# Patient Record
Sex: Female | Born: 1940
Health system: Southern US, Community
[De-identification: ages and names within clinical notes are randomized; demographics above are authoritative.]

## PROBLEM LIST (undated history)

## (undated) DIAGNOSIS — I10 Essential (primary) hypertension: Secondary | ICD-10-CM

## (undated) DIAGNOSIS — K589 Irritable bowel syndrome without diarrhea: Secondary | ICD-10-CM

## (undated) HISTORY — PX: ABDOMINAL HYSTERECTOMY: SHX81

---

## 2000-01-19 ENCOUNTER — Ambulatory Visit (HOSPITAL_COMMUNITY): Admission: RE | Admit: 2000-01-19 | Discharge: 2000-01-19 | Payer: Self-pay | Admitting: Emergency Medicine

## 2000-01-19 ENCOUNTER — Encounter: Payer: Self-pay | Admitting: Emergency Medicine

## 2007-09-18 ENCOUNTER — Encounter: Admission: RE | Admit: 2007-09-18 | Discharge: 2007-09-18 | Payer: Self-pay | Admitting: Gastroenterology

## 2008-12-31 ENCOUNTER — Ambulatory Visit: Payer: Self-pay | Admitting: Diagnostic Radiology

## 2008-12-31 ENCOUNTER — Emergency Department (HOSPITAL_BASED_OUTPATIENT_CLINIC_OR_DEPARTMENT_OTHER): Admission: EM | Admit: 2008-12-31 | Discharge: 2008-12-31 | Payer: Self-pay | Admitting: Emergency Medicine

## 2009-01-01 ENCOUNTER — Emergency Department (HOSPITAL_BASED_OUTPATIENT_CLINIC_OR_DEPARTMENT_OTHER): Admission: EM | Admit: 2009-01-01 | Discharge: 2009-01-01 | Payer: Self-pay | Admitting: Emergency Medicine

## 2009-01-01 ENCOUNTER — Ambulatory Visit: Payer: Self-pay | Admitting: Interventional Radiology

## 2010-05-28 IMAGING — CT CT HEAD W/O CM
1 series · 16 of 30 positions shown, 20 images · non-contrast
Comparison: the previous day's study

CLINICAL DATA: Fell

CT HEAD WITHOUT CONTRAST
TECHNIQUE: Contiguous axial images were obtained from the base of
the skull through the vertex without contrast.

[Series 2: head 4.8 h37s · axial · 0.45mm/px · z∈[-117,+16]mm · 16 of 32 slices shown, 20 images]
[im 2/32  brain]
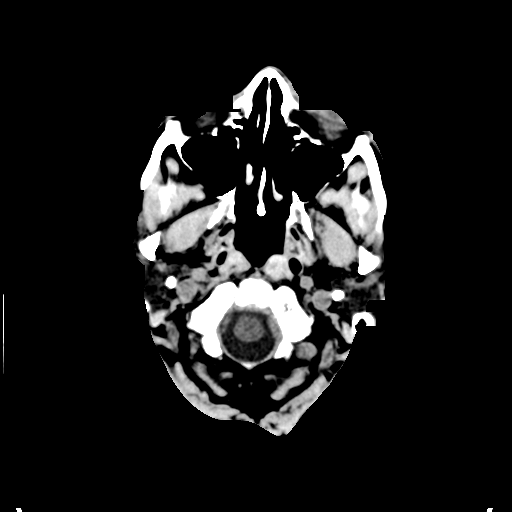
[im 2/32  bone]
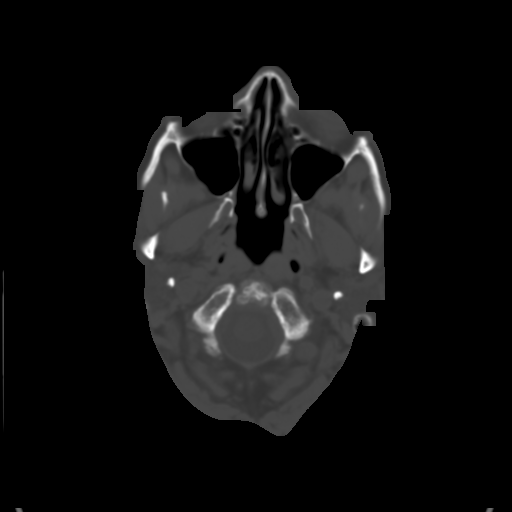
[im 4/32  brain]
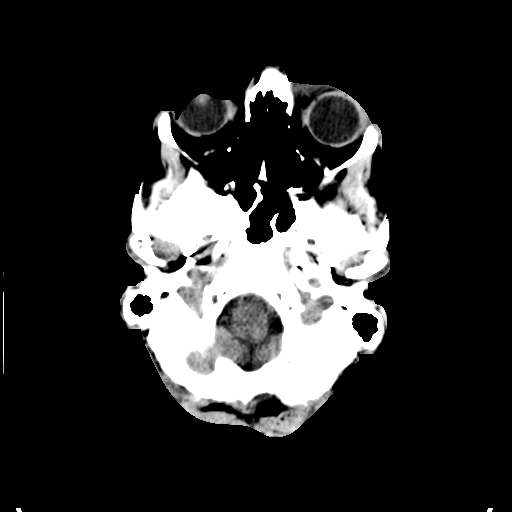
[im 6/32  brain]
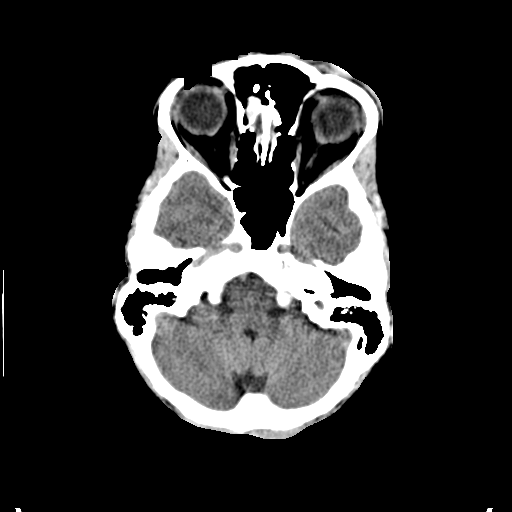
[im 8/32  brain]
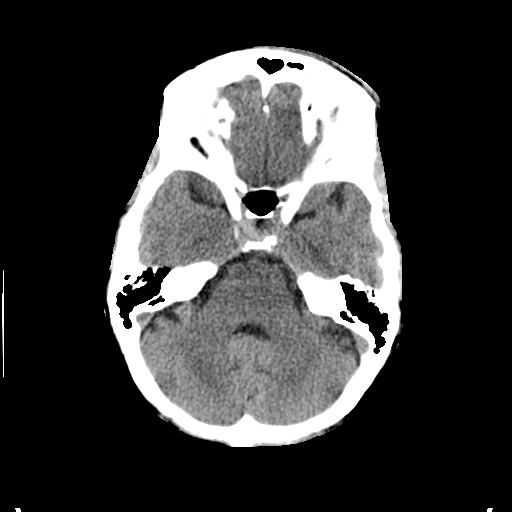
[im 9/32  brain]
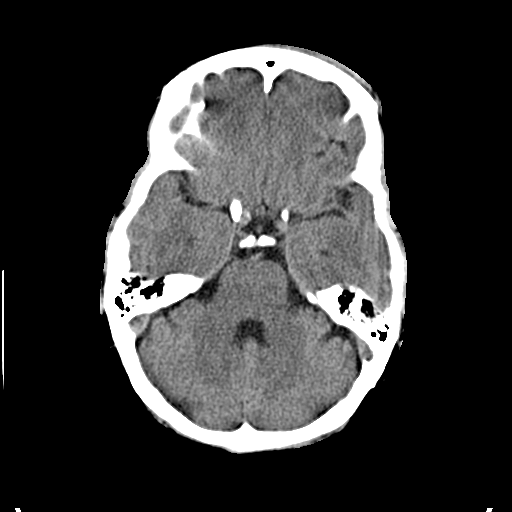
[im 9/32  bone]
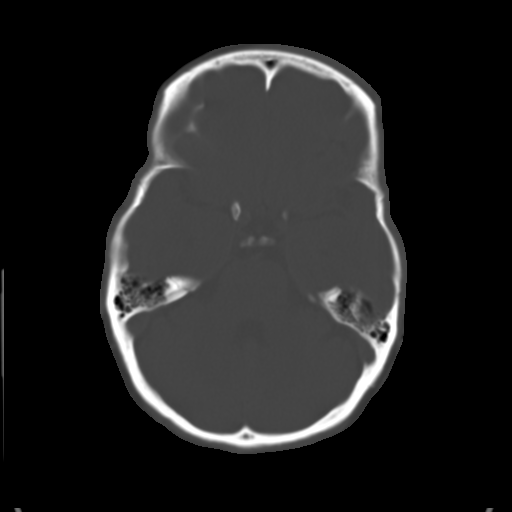
[im 11/32  brain]
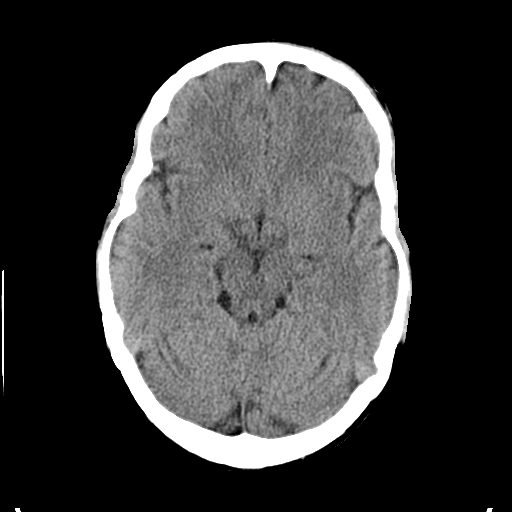
[im 13/32  brain]
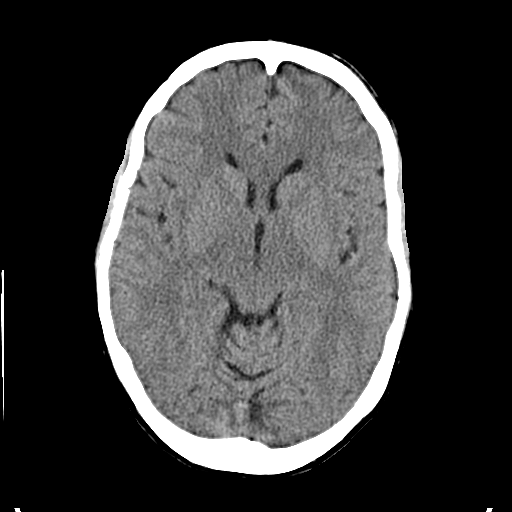
[im 15/32  brain]
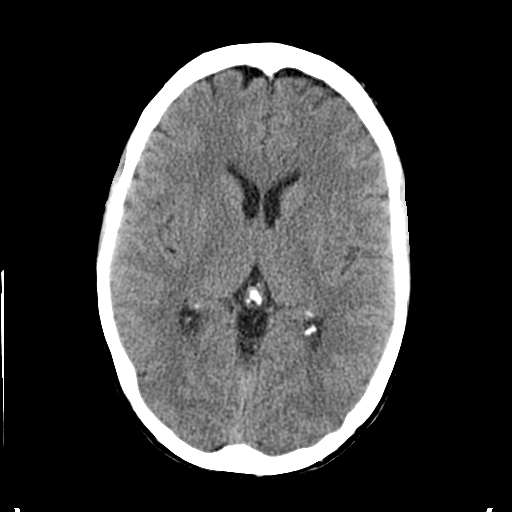
[im 17/32  brain]
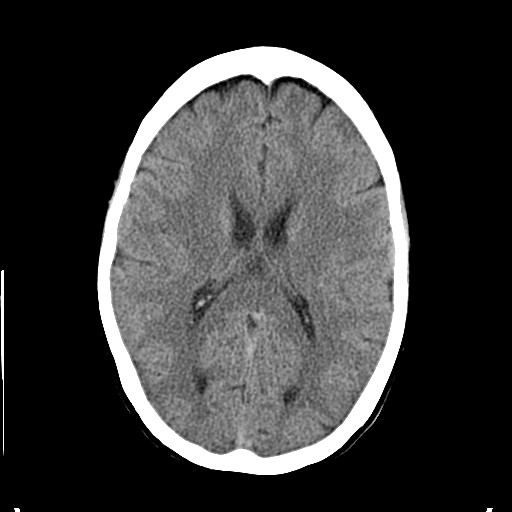
[im 17/32  bone]
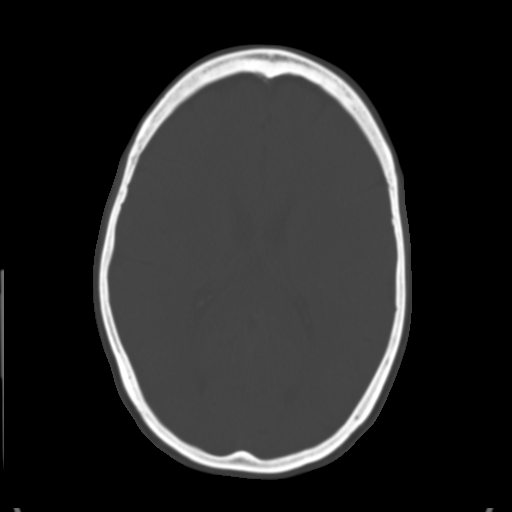
[im 19/32  brain]
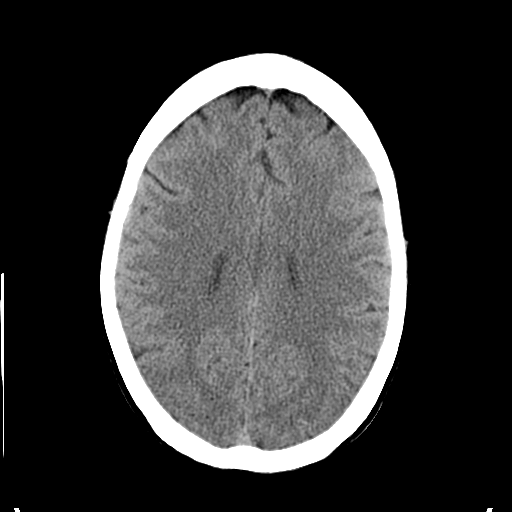
[im 21/32  brain]
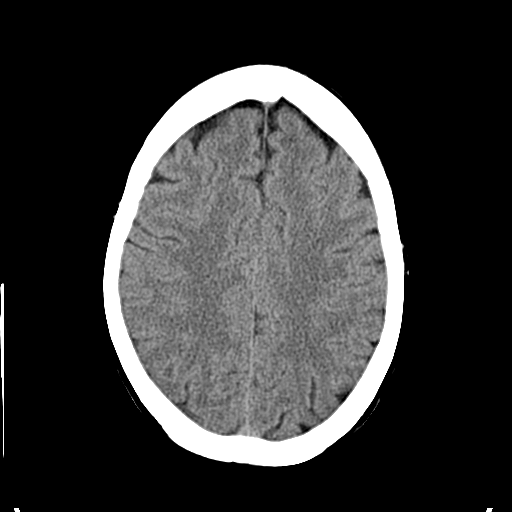
[im 23/32  brain]
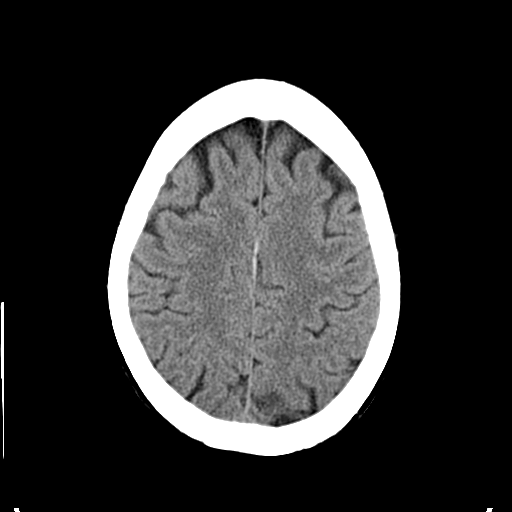
[im 24/32  brain]
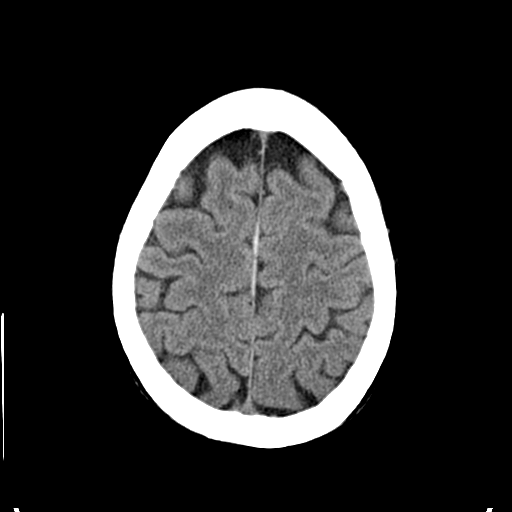
[im 24/32  bone]
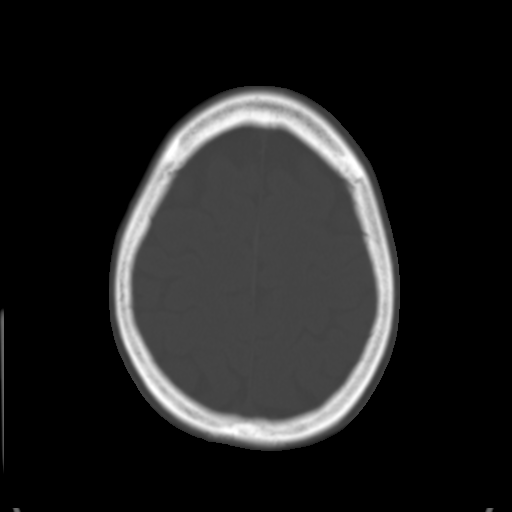
[im 26/32  brain]
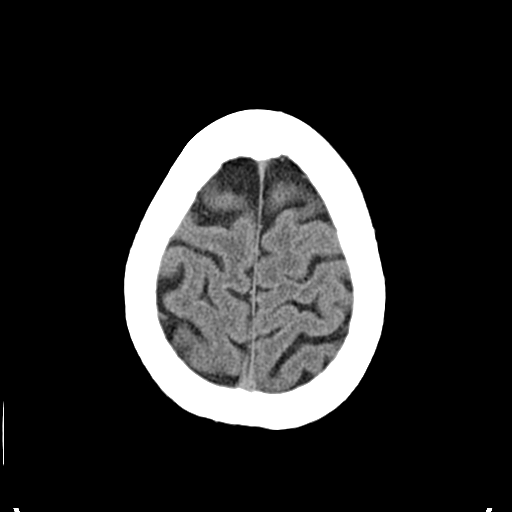
[im 28/32  brain]
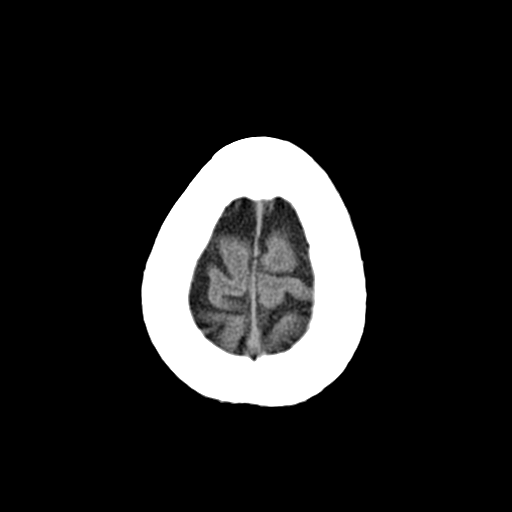
[im 30/32  brain]
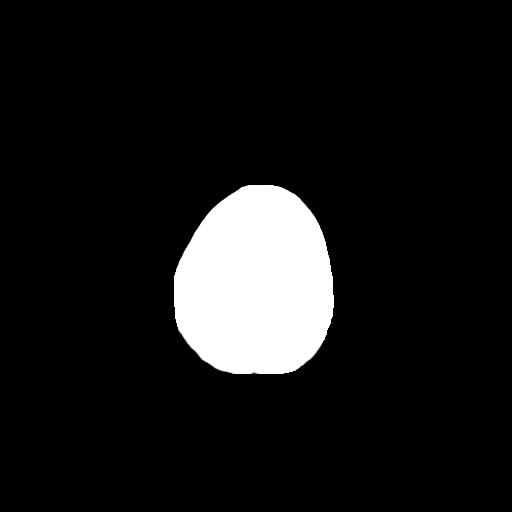

[16 of 30 positions shown; findings below may reference images not displayed]

FINDINGS: Left frontal scalp hematoma.  The right parafalcine
subdural hematoma near the vertex is stable in size, measuring
approximate 2 mm maximal thickness.  No new intracranial
hemorrhage.  No focal parenchymal edema, midline shift, mass, or
mass effect.  Acute infarct may be inapparent on noncontrast CT.
The ventricles and sulci are normal in size and symmetry.  Bone
windows show no calvarial lesions.
IMPRESSION: 1.  Stable small right falcine subdural hematoma near the vertex.
2.  Negative for acute or superimposed abnormality.

## 2011-03-26 LAB — CBC
HCT: 37.6 % (ref 36.0–46.0)
Hemoglobin: 12.9 g/dL (ref 12.0–15.0)
Platelets: 206 10*3/uL (ref 150–400)
RBC: 4.29 MIL/uL (ref 3.87–5.11)
WBC: 5.4 10*3/uL (ref 4.0–10.5)

## 2011-03-26 LAB — BASIC METABOLIC PANEL
GFR calc non Af Amer: >60 mL/min (ref >60)
Potassium: 3.8 mEq/L (ref 3.5–5.1)
Sodium: 137 mEq/L (ref 135–145)

## 2011-03-26 LAB — DIFFERENTIAL
Eosinophils Relative: 1 % (ref 0–5)
Lymphocytes Relative: 19 % (ref 12–46)
Lymphs Abs: 1 10*3/uL (ref 0.7–4.0)
Monocytes Absolute: 0.3 10*3/uL (ref 0.1–1.0)
Monocytes Relative: 5 % (ref 3–12)

## 2011-03-26 LAB — ETHANOL: Alcohol, Ethyl (B): 72 mg/dL — ABNORMAL HIGH (ref 0–10)

## 2012-07-25 DIAGNOSIS — K649 Unspecified hemorrhoids: Secondary | ICD-10-CM | POA: Insufficient documentation

## 2012-08-05 ENCOUNTER — Ambulatory Visit (INDEPENDENT_AMBULATORY_CARE_PROVIDER_SITE_OTHER): Payer: TRICARE For Life (TFL) | Admitting: General Surgery

## 2012-08-21 DIAGNOSIS — I10 Essential (primary) hypertension: Secondary | ICD-10-CM | POA: Diagnosis present

## 2012-10-06 DIAGNOSIS — R140 Abdominal distension (gaseous): Secondary | ICD-10-CM | POA: Insufficient documentation

## 2012-10-06 DIAGNOSIS — K5904 Chronic idiopathic constipation: Secondary | ICD-10-CM | POA: Insufficient documentation

## 2013-01-08 DIAGNOSIS — K6389 Other specified diseases of intestine: Secondary | ICD-10-CM | POA: Insufficient documentation

## 2013-01-08 DIAGNOSIS — K589 Irritable bowel syndrome without diarrhea: Secondary | ICD-10-CM | POA: Insufficient documentation

## 2013-12-21 DIAGNOSIS — K635 Polyp of colon: Secondary | ICD-10-CM | POA: Insufficient documentation

## 2015-01-05 ENCOUNTER — Ambulatory Visit (INDEPENDENT_AMBULATORY_CARE_PROVIDER_SITE_OTHER): Payer: Medicare Other

## 2015-01-05 ENCOUNTER — Ambulatory Visit (INDEPENDENT_AMBULATORY_CARE_PROVIDER_SITE_OTHER): Payer: Medicare Other | Admitting: Podiatry

## 2015-01-05 VITALS — BP 110/62 | HR 63 | Resp 17

## 2015-01-05 DIAGNOSIS — M2040 Other hammer toe(s) (acquired), unspecified foot: Secondary | ICD-10-CM

## 2015-01-05 DIAGNOSIS — M779 Enthesopathy, unspecified: Secondary | ICD-10-CM

## 2015-01-05 NOTE — Progress Notes (Signed)
   Subjective:    Patient ID: Katherine Garrison, female    DOB: [birth year], 74 y.o.   MRN: [id number]  HPI  Pt presents with painful hammertoe deformity on right foot 2nd and 3rd met  Review of Systems  Gastrointestinal: Positive for constipation and abdominal distention.  Allergic/Immunologic: Positive for environmental allergies.       Objective:   Physical Exam        Assessment & Plan:

## 2015-01-06 NOTE — Progress Notes (Signed)
Subjective:     Patient ID: Katherine RunningSusan B Garrison, female   DOB: Oct 13, 1941, [age] y.o.   MRN: [id number]  HPI patient presents with pain in the lesser MPJs left and has old orthotics which are no longer adequate   Review of Systems  All other systems reviewed and are negative.      Objective:   Physical Exam  Constitutional: She is oriented to person, place, and time.  Cardiovascular: Intact distal pulses.   Musculoskeletal: Normal range of motion.  Neurological: She is oriented to person, place, and time.  Skin: Skin is warm.  Nursing note and vitals reviewed.  neurovascular status intact with muscle strength adequate range of motion within normal limits and elevation of the lesser digits with mild redness on the dorsal surfaces. 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 Patient wants to have them made and is scanned today for custom type devices

## 2015-01-26 ENCOUNTER — Ambulatory Visit: Payer: Medicare Other | Admitting: *Deleted

## 2015-01-26 DIAGNOSIS — M779 Enthesopathy, unspecified: Secondary | ICD-10-CM

## 2015-01-26 NOTE — Patient Instructions (Signed)

## 2015-01-26 NOTE — Progress Notes (Signed)
PICKING UP INSERTS  

## 2015-11-21 DIAGNOSIS — N952 Postmenopausal atrophic vaginitis: Secondary | ICD-10-CM

## 2015-11-21 DIAGNOSIS — F419 Anxiety disorder, unspecified: Secondary | ICD-10-CM | POA: Diagnosis present

## 2015-11-21 DIAGNOSIS — J309 Allergic rhinitis, unspecified: Secondary | ICD-10-CM | POA: Insufficient documentation

## 2015-11-21 HISTORY — DX: Postmenopausal atrophic vaginitis: N95.2

## 2016-06-22 DIAGNOSIS — M79609 Pain in unspecified limb: Secondary | ICD-10-CM

## 2016-09-24 DIAGNOSIS — M8580 Other specified disorders of bone density and structure, unspecified site: Secondary | ICD-10-CM | POA: Insufficient documentation

## 2017-06-24 DIAGNOSIS — H40053 Ocular hypertension, bilateral: Secondary | ICD-10-CM | POA: Insufficient documentation

## 2017-08-02 ENCOUNTER — Emergency Department (HOSPITAL_BASED_OUTPATIENT_CLINIC_OR_DEPARTMENT_OTHER): Payer: Medicare Other

## 2017-08-02 ENCOUNTER — Encounter (HOSPITAL_BASED_OUTPATIENT_CLINIC_OR_DEPARTMENT_OTHER): Payer: Self-pay

## 2017-08-02 ENCOUNTER — Emergency Department (HOSPITAL_BASED_OUTPATIENT_CLINIC_OR_DEPARTMENT_OTHER)
Admission: EM | Admit: 2017-08-02 | Discharge: 2017-08-02 | Disposition: A | Discharge status: Home / Self Care | Payer: Medicare Other | Attending: Emergency Medicine | Admitting: Emergency Medicine

## 2017-08-02 DIAGNOSIS — S9031XA Contusion of right foot, initial encounter: Secondary | ICD-10-CM | POA: Insufficient documentation

## 2017-08-02 DIAGNOSIS — E860 Dehydration: Secondary | ICD-10-CM | POA: Insufficient documentation

## 2017-08-02 DIAGNOSIS — Z91040 Latex allergy status: Secondary | ICD-10-CM | POA: Diagnosis not present

## 2017-08-02 DIAGNOSIS — I10 Essential (primary) hypertension: Secondary | ICD-10-CM | POA: Diagnosis not present

## 2017-08-02 DIAGNOSIS — R197 Diarrhea, unspecified: Secondary | ICD-10-CM | POA: Diagnosis not present

## 2017-08-02 DIAGNOSIS — Y92012 Bathroom of single-family (private) house as the place of occurrence of the external cause: Secondary | ICD-10-CM | POA: Diagnosis not present

## 2017-08-02 DIAGNOSIS — Z79899 Other long term (current) drug therapy: Secondary | ICD-10-CM | POA: Diagnosis not present

## 2017-08-02 DIAGNOSIS — Y9389 Activity, other specified: Secondary | ICD-10-CM | POA: Diagnosis not present

## 2017-08-02 DIAGNOSIS — R112 Nausea with vomiting, unspecified: Secondary | ICD-10-CM | POA: Diagnosis not present

## 2017-08-02 DIAGNOSIS — W19XXXA Unspecified fall, initial encounter: Secondary | ICD-10-CM | POA: Diagnosis not present

## 2017-08-02 DIAGNOSIS — Z87891 Personal history of nicotine dependence: Secondary | ICD-10-CM | POA: Insufficient documentation

## 2017-08-02 DIAGNOSIS — R110 Nausea: Secondary | ICD-10-CM | POA: Diagnosis present

## 2017-08-02 DIAGNOSIS — Y998 Other external cause status: Secondary | ICD-10-CM | POA: Diagnosis not present

## 2017-08-02 HISTORY — DX: Essential (primary) hypertension: I10

## 2017-08-02 HISTORY — DX: Irritable bowel syndrome without diarrhea: K58.9

## 2017-08-02 HISTORY — DX: Irritable bowel syndrome, unspecified: K58.9

## 2017-08-02 LAB — COMPREHENSIVE METABOLIC PANEL
ALK PHOS: 54 U/L (ref 38–126)
ALT: 16 U/L (ref 14–54)
AST: 29 U/L (ref 15–41)
Albumin: 4.5 g/dL (ref 3.5–5.0)
Anion gap: 9 (ref 5–15)
BUN: 14 mg/dL (ref 6–20)
CHLORIDE: 101 mmol/L (ref 101–111)
CO2: 28 mmol/L (ref 22–32)
CREATININE: 0.72 mg/dL (ref 0.44–1.00)
Calcium: 9.2 mg/dL (ref 8.9–10.3)
Glucose, Bld: 126 mg/dL — ABNORMAL HIGH (ref 65–99)
Potassium: 3.3 mmol/L — ABNORMAL LOW (ref 3.5–5.1)
Sodium: 138 mmol/L (ref 135–145)
TOTAL PROTEIN: 7.7 g/dL (ref 6.5–8.1)
Total Bilirubin: 0.9 mg/dL (ref 0.3–1.2)

## 2017-08-02 LAB — URINALYSIS, ROUTINE W REFLEX MICROSCOPIC
Bilirubin Urine: NEGATIVE
GLUCOSE, UA: NEGATIVE mg/dL
Hgb urine dipstick: NEGATIVE
KETONES UR: NEGATIVE mg/dL
NITRITE: NEGATIVE
PH: 7.5 (ref 5.0–8.0)
Protein, ur: 30 mg/dL — AB
SPECIFIC GRAVITY, URINE: 1.017 (ref 1.005–1.030)

## 2017-08-02 LAB — CBC
HEMATOCRIT: 42.7 % (ref 36.0–46.0)
HEMOGLOBIN: 14.6 g/dL (ref 12.0–15.0)
MCH: 30.2 pg (ref 26.0–34.0)
MCHC: 34.2 g/dL (ref 30.0–36.0)
MCV: 88.4 fL (ref 78.0–100.0)
Platelets: 180 10*3/uL (ref 150–400)
RBC: 4.83 MIL/uL (ref 3.87–5.11)
RDW: 12.6 % (ref 11.5–15.5)
WBC: 10.5 10*3/uL (ref 4.0–10.5)

## 2017-08-02 LAB — LIPASE, BLOOD: Lipase: 34 U/L (ref 11–51)

## 2017-08-02 LAB — URINALYSIS, MICROSCOPIC (REFLEX): RBC / HPF: NONE SEEN RBC/hpf (ref 0–5)

## 2017-08-02 LAB — I-STAT CG4 LACTIC ACID, ED: Lactic Acid, Venous: 2.05 mmol/L (ref 0.5–1.9)

## 2017-08-02 MED ORDER — ONDANSETRON HCL 4 MG PO TABS: 4.0000 mg | ORAL_TABLET | Freq: Four times a day (QID) | ORAL | 0 refills | Status: DC | PRN

## 2017-08-02 MED ORDER — ONDANSETRON HCL 4 MG/2ML IJ SOLN
4.0000 mg | Freq: Once | INTRAMUSCULAR | Status: AC | PRN
  Administered 2017-08-02: 4 mg via INTRAVENOUS
  Filled 2017-08-02: qty 2

## 2017-08-02 MED ORDER — SODIUM CHLORIDE 0.9 % IV BOLUS (SEPSIS)
1000.0000 mL | Freq: Once | INTRAVENOUS | Status: AC
  Administered 2017-08-02: 1000 mL via INTRAVENOUS

## 2017-08-02 MED ORDER — CEPHALEXIN 500 MG PO CAPS: 500.0000 mg | ORAL_CAPSULE | Freq: Two times a day (BID) | ORAL | 0 refills | Status: DC

## 2017-08-02 MED ORDER — CEPHALEXIN 250 MG PO CAPS
500.0000 mg | ORAL_CAPSULE | Freq: Once | ORAL | Status: AC
  Administered 2017-08-02: 500 mg via ORAL
  Filled 2017-08-02: qty 2

## 2017-08-02 MED FILL — CEPHALEXIN 500 MG CAPSULE: 500 | 7 days supply | Qty: 14 | Fill #0

## 2017-08-02 MED FILL — ONDANSETRON HCL 4 MG TABLET: 4 | 3 days supply | Qty: 12 | Fill #0

## 2017-08-02 NOTE — ED Notes (Signed)
Pt shivering nonstop during EKG. MD aware.

## 2017-08-02 NOTE — ED Triage Notes (Signed)
Pt arrived via [facility] via [facility]. Pt c/o abd pain with N/V that started this AM. Pt reports. Pt had a near syncopal episodes. Pt has family members that are ill.

## 2017-08-02 NOTE — ED Notes (Signed)
Pt c/o R foot pain which she states she injured during her near syncopal episode.

## 2017-08-02 NOTE — ED Notes (Signed)
ED Provider at bedside. 

## 2017-08-02 NOTE — ED Notes (Signed)
Lactic Acid 2.05 reported to Denice Bors RN

## 2017-08-02 NOTE — ED Notes (Signed)
Pt drinking water 

## 2017-08-02 NOTE — ED Notes (Signed)
B[facility] to room.

## 2017-08-02 NOTE — ED Provider Notes (Signed)
[facility]   CSN: [id number] Arrival date & time: 08/02/17  4098     History   Chief Complaint Chief Complaint  Patient presents with  . Nausea    HPI Katherine Garrison is a 76 y.o. female.  The history is provided by the patient. No language interpreter was used.   Katherine Garrison is a 76 y.o. female who presents to the Emergency Department complaining of N/V/D.  Early this morning around 3 AM she developed severe diarrhea with numerous watery stools associated vomiting, 5-6 episodes. She denies any hematochezia or melena. No fevers but she does have chills and abdominal cramping. Her husband has similar symptoms and her son recently visited and had similar symptoms. No known bad food exposures. Symptoms are severe and constant in nature. She was experiencing an episode of diarrhea and emesis at the same time and then attempted to get up and felt the floor, hurting her foot. No known head injury. She may have transiently lost consciousness. No chest pain or shortness of breath. Past Medical History:  Diagnosis Date  . Hypertension   . IBS (irritable bowel syndrome)     There are no active problems to display for this patient.   Past Surgical History:  Procedure Laterality Date  . ABDOMINAL HYSTERECTOMY      OB History    No data available       Home Medications    Prior to Admission medications   Medication Sig Start Date End Date Taking? Authorizing Provider  lubiprostone (AMITIZA) 8 MCG capsule Take 8 mcg by mouth 2 (two) times daily with a meal.   Yes [provider]  amLODipine (NORVASC) 2.5 MG tablet Take 2.5 mg by mouth daily.    [provider]  busPIRone (BUSPAR) 15 MG tablet Take 15 mg by mouth 3 (three) times daily.    [provider]  Calcium-Vitamin D-Vitamin K (VIACTIV PO) Take by mouth.    [provider]  cephALEXin (KEFLEX) 500 MG capsule Take 1 capsule (500 mg total) by mouth 2 (two) times daily.  08/02/17   Tilden Fossa, MD  dicyclomine (BENTYL) 10 MG capsule Take 10 mg by mouth 4 (four) times daily -  before meals and at bedtime.    [provider]  estrogens, conjugated, (PREMARIN) 0.625 MG tablet Take 0.625 mg by mouth daily. Take daily for 21 days then do not take for 7 days.    [provider]  fluticasone (VERAMYST) 27.5 MCG/SPRAY nasal spray Place 2 sprays into the nose daily.    [provider]  Multiple Vitamins-Minerals (WOMENS ONE DAILY PO) Take by mouth.    [provider]  ondansetron (ZOFRAN) 4 MG tablet Take 1 tablet (4 mg total) by mouth every 6 (six) hours as needed for nausea or vomiting. 08/02/17   Tilden Fossa, MD  Polyethylene Glycol 3350 (MIRALAX PO) Take by mouth.    [provider]  Probiotic Product (ALIGN PO) Take by mouth.    [provider]  sertraline (ZOLOFT) 50 MG tablet Take 50 mg by mouth daily.    [provider]  valsartan-hydrochlorothiazide (DIOVAN-HCT) 320-25 MG per tablet Take 1 tablet by mouth daily.    [provider]    Family History No family history on file.  Social History Social History  Substance Use Topics  . Smoking status: Former Games developer  . Smokeless tobacco: Never Used  . Alcohol use Yes     Comment: occasional  Allergies   Latex; Neosporin [neomycin-bacitracin zn-polymyx]; and Sulfa antibiotics   Review of Systems Review of Systems  All other systems reviewed and are negative.    Physical Exam Updated Vital Signs BP 135/83 (BP Location: Right Arm)   Pulse 86   Temp 97.9 F (36.6 C) (Rectal)   Resp 18   Ht 5\' 6"  (1.676 m)   Wt 55.3 kg (122 lb)   SpO2 100%   BMI 19.69 kg/m   Physical Exam  Constitutional: She is oriented to person, place, and time. She appears well-developed and well-nourished.  HENT:  Head: Normocephalic and atraumatic.  Cardiovascular: Normal rate and regular rhythm.   No murmur heard. Pulmonary/Chest: Effort  normal and breath sounds normal. No respiratory distress.  Abdominal: Soft. There is no rebound and no guarding.  Moderate lower abdominal tenderness  Musculoskeletal:  2+ DP pulses bilaterally. There is ecchymosis and mild tenderness to the lateral aspect of the right midfoot. No ankle tenderness to palpation.  Neurological: She is alert and oriented to person, place, and time.  Skin: Skin is warm and dry.  Psychiatric: She has a normal mood and affect. Her behavior is normal.  Nursing note and vitals reviewed.    ED Treatments / Results  Labs (all labs ordered are listed, but only abnormal results are displayed) Labs Reviewed  COMPREHENSIVE METABOLIC PANEL - Abnormal; Notable for the following:       Result Value   Potassium 3.3 (*)    Glucose, Bld 126 (*)    All other components within normal limits  URINALYSIS, ROUTINE W REFLEX MICROSCOPIC - Abnormal; Notable for the following:    APPearance TURBID (*)    Protein, ur 30 (*)    Leukocytes, UA SMALL (*)    All other components within normal limits  URINALYSIS, MICROSCOPIC (REFLEX) - Abnormal; Notable for the following:    Bacteria, UA MANY (*)    Squamous Epithelial / LPF 6-30 (*)    All other components within normal limits  I-STAT CG4 LACTIC ACID, ED - Abnormal; Notable for the following:    Lactic Acid, Venous 2.05 (*)    All other components within normal limits  URINE CULTURE  LIPASE, BLOOD  CBC    EKG  EKG Interpretation  Date/Time:  Friday August 02 2017 10:13:27 EDT Ventricular Rate:  77 PR Interval:    QRS Duration: 137 QT Interval:  557 QTC Calculation: 631 R Axis:   67 Text Interpretation:  Sinus rhythm IVCD, consider atypical RBBB Baseline wander in lead(s) V1 Interpretation limited secondary to artifact Confirmed by Tilden Fossa [phone]) on 08/02/2017 10:23:08 AM       Radiology Dg Foot Complete Right  Result Date: 08/02/2017 CLINICAL DATA:  76 year old female with foot pain. EXAM: RIGHT FOOT  COMPLETE - 3+ VIEW COMPARISON:  None. FINDINGS: There is no evidence of fracture or dislocation. There is diffuse osteopenia, otherwise no focal bone abnormality. Soft tissues are unremarkable. IMPRESSION: Negative. Electronically Signed   By: Sande Brothers M.D.   On: 08/02/2017 10:40    Procedures Procedures (including critical care time)  Medications Ordered in ED Medications  ondansetron (ZOFRAN) injection 4 mg (4 mg Intravenous Given 08/02/17 0956)  sodium chloride 0.9 % bolus 1,000 mL (0 mLs Intravenous Stopped 08/02/17 1113)  cephALEXin (KEFLEX) capsule 500 mg (500 mg Oral Given 08/02/17 1109)     Initial Impression / Assessment and Plan / ED Course  I have reviewed the triage vital signs and the nursing notes.  Pertinent labs & imaging results that were available during my care of the patient were reviewed by me and considered in my medical decision making (see chart for details).     Patient here for evaluation of nausea, vomiting, diarrhea, near syncopal episode. UA with many bacteria, few leukocytes, few squamous. She does not have any urinary symptoms but given many bacteria will send culture initiated for possible cystitis. In terms of vomiting and diarrhea this is likely related to gastroenteritis with multiple contacts with similar symptoms. She is feeling improved on repeat assessment. Plan to DC home with outpatient follow-up and return precautions.  Current clinical picture is not consistent with serious bacterial infection, perforated viscus, diverticulitis.  Final Clinical Impressions(s) / ED Diagnoses   Final diagnoses:  Nausea vomiting and diarrhea  Contusion of right foot, initial encounter  Dehydration    New Prescriptions Discharge Medication List as of 08/02/2017 11:16 AM    START taking these medications   Details  cephALEXin (KEFLEX) 500 MG capsule Take 1 capsule (500 mg total) by mouth 2 (two) times daily., Starting Fri 08/02/2017, Print    ondansetron  (ZOFRAN) 4 MG tablet Take 1 tablet (4 mg total) by mouth every 6 (six) hours as needed for nausea or vomiting., Starting Fri 08/02/2017, Print         Tilden Fossa, MD 08/02/17 1539

## 2017-08-03 LAB — URINE CULTURE: Culture: NO GROWTH

## 2018-05-24 DIAGNOSIS — Z87898 Personal history of other specified conditions: Secondary | ICD-10-CM | POA: Insufficient documentation

## 2018-05-24 HISTORY — DX: Personal history of other specified conditions: Z87.898

## 2018-11-18 DIAGNOSIS — I700 Atherosclerosis of aorta: Secondary | ICD-10-CM | POA: Insufficient documentation

## 2018-12-24 DIAGNOSIS — K5732 Diverticulitis of large intestine without perforation or abscess without bleeding: Secondary | ICD-10-CM | POA: Insufficient documentation

## 2018-12-26 DIAGNOSIS — K5730 Diverticulosis of large intestine without perforation or abscess without bleeding: Secondary | ICD-10-CM

## 2018-12-26 HISTORY — DX: Diverticulosis of large intestine without perforation or abscess without bleeding: K57.30

## 2018-12-27 IMAGING — DX DG FOOT COMPLETE 3+V*R*
3 series · 3 of 3 positions shown · non-contrast
Comparison: None.

CLINICAL DATA: 75-year-old female with foot pain.

EXAM:
RIGHT FOOT COMPLETE - 3+ VIEW

[foot ap]
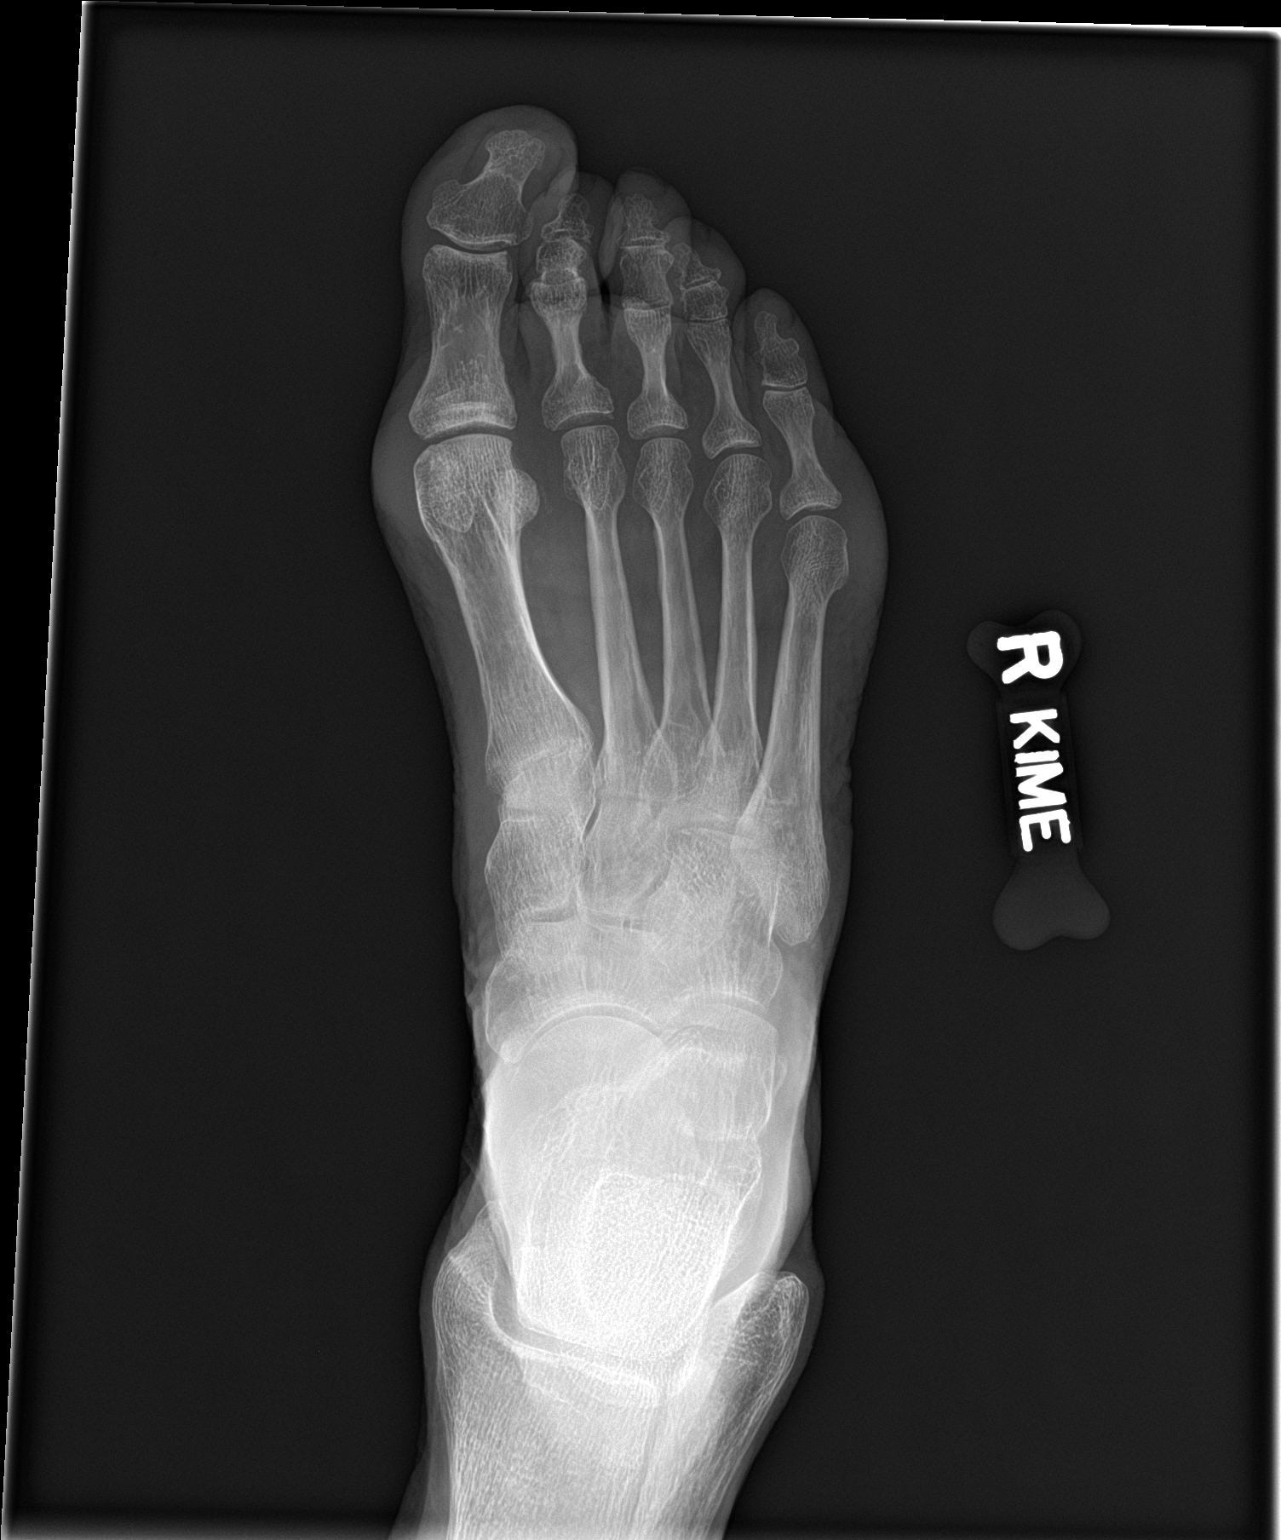

[foot obl]
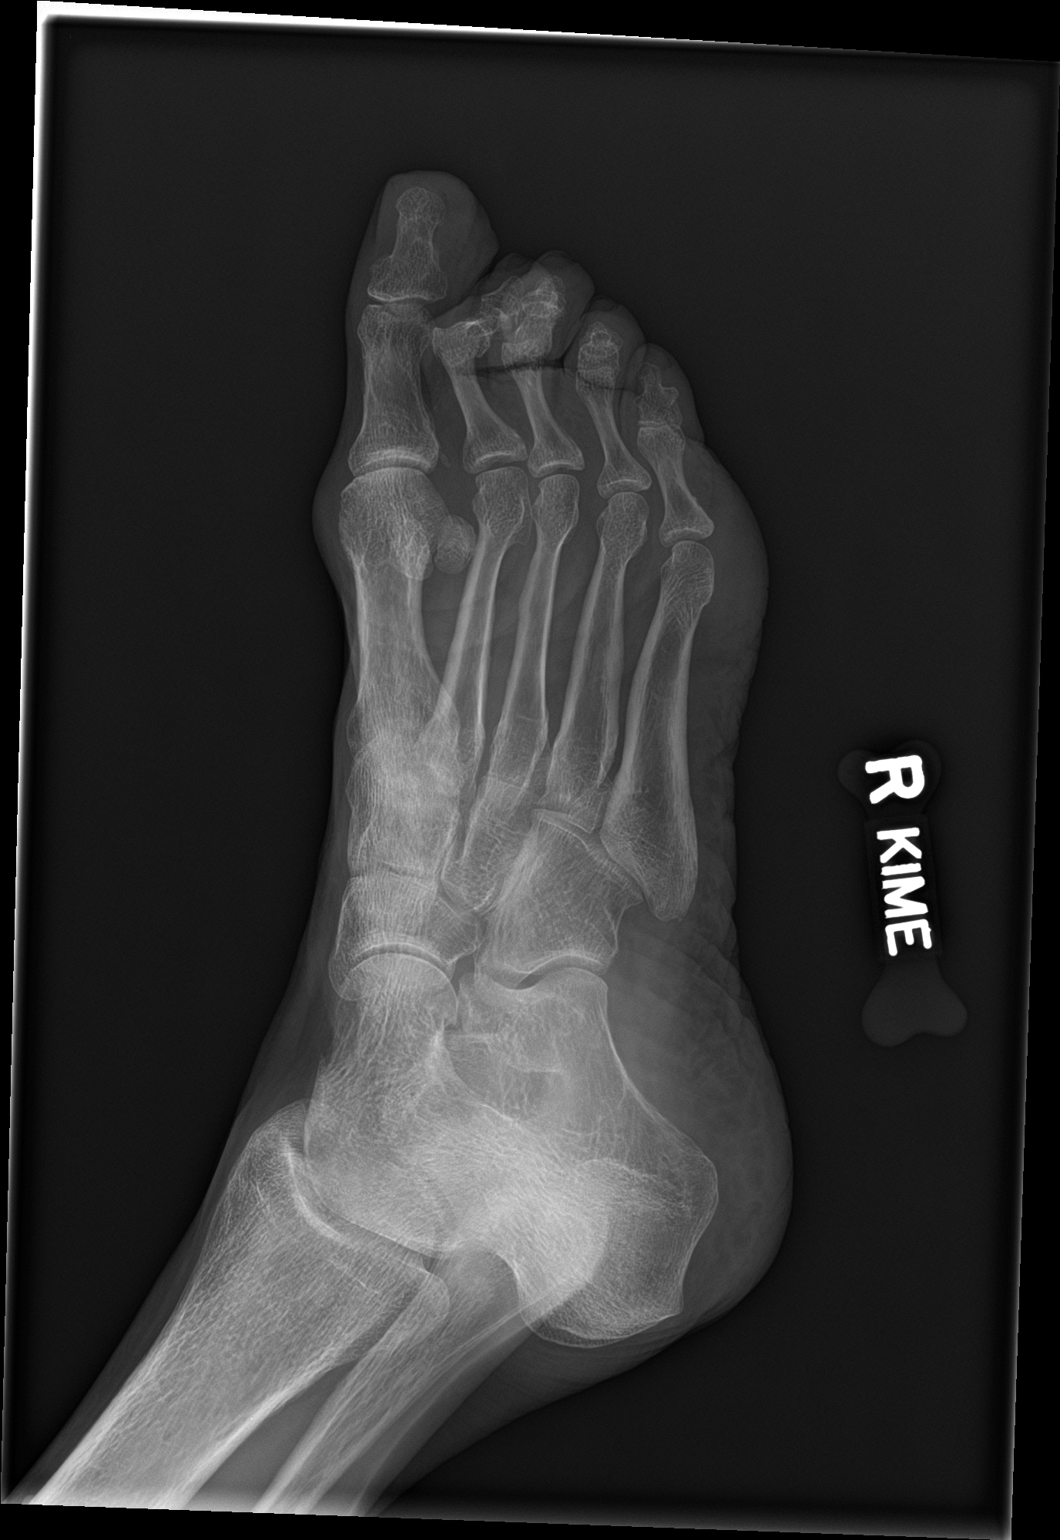

[foot lat]
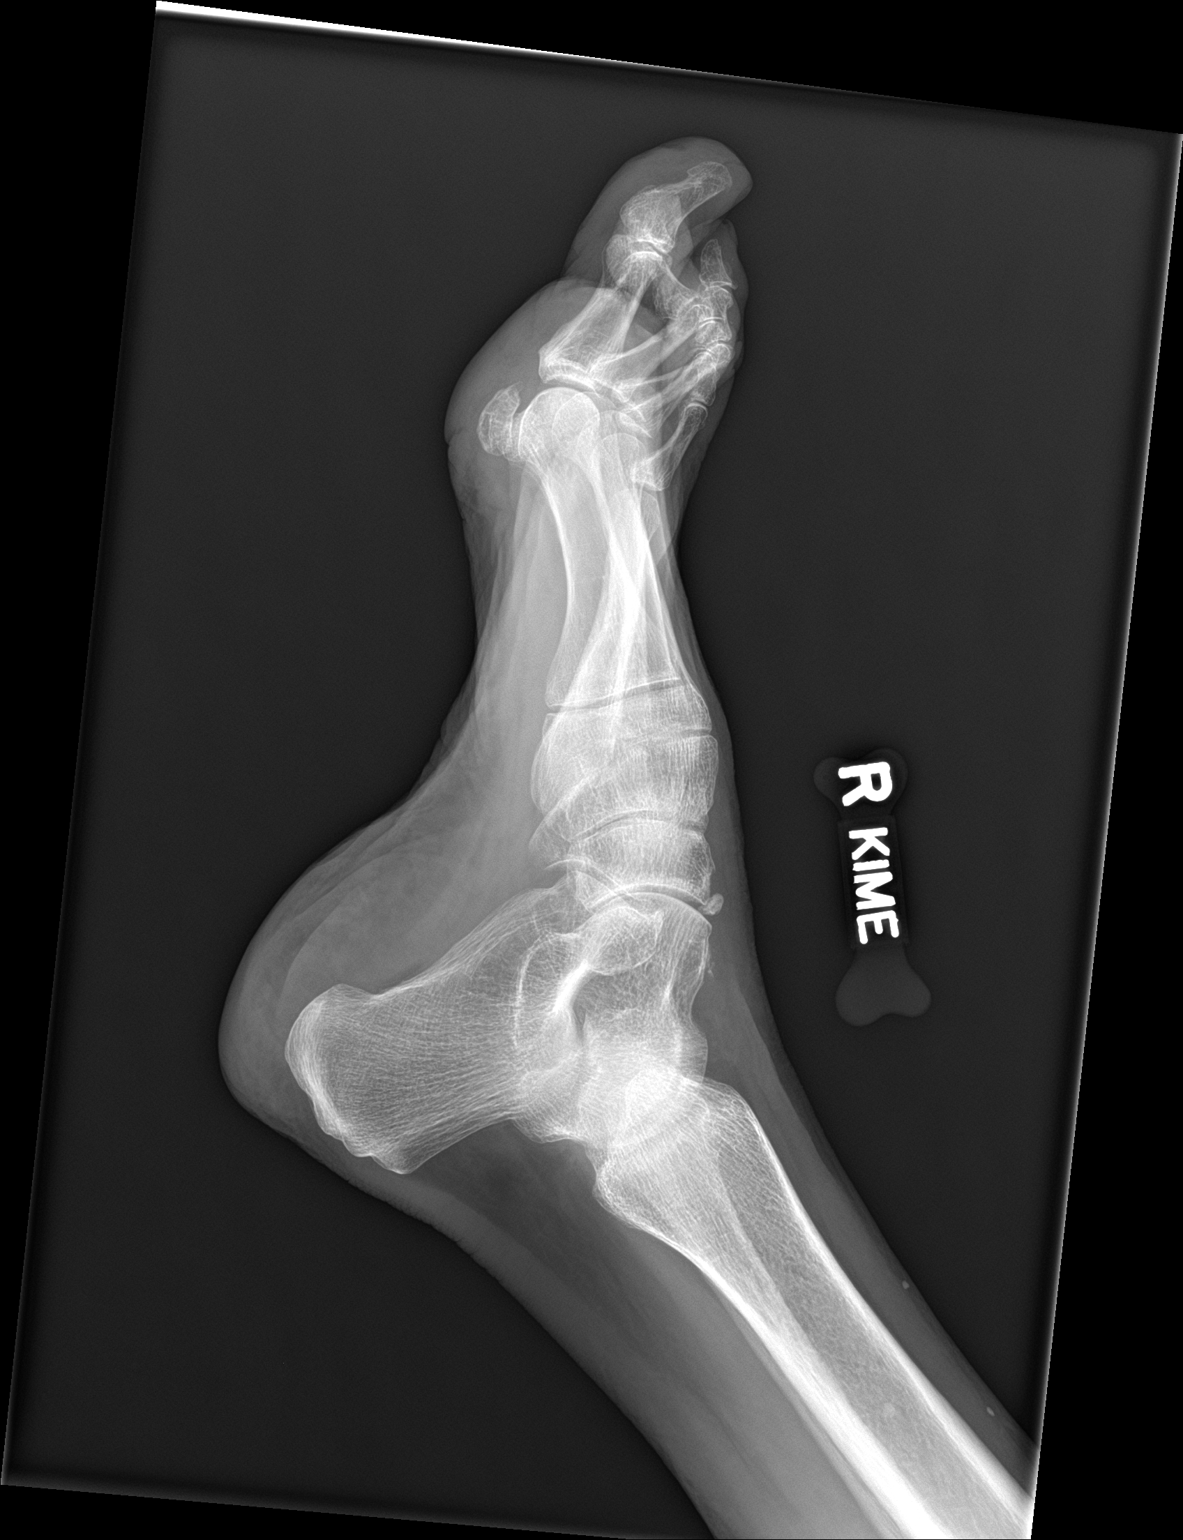

[3 of 3 positions shown; findings below may reference images not displayed]

FINDINGS: There is no evidence of fracture or dislocation. There is diffuse
osteopenia, otherwise no focal bone abnormality. Soft tissues are
unremarkable.
IMPRESSION: Negative.

## 2019-01-07 DIAGNOSIS — Z8719 Personal history of other diseases of the digestive system: Secondary | ICD-10-CM

## 2019-01-07 HISTORY — DX: Personal history of other diseases of the digestive system: Z87.19

## 2020-08-23 DIAGNOSIS — H02831 Dermatochalasis of right upper eyelid: Secondary | ICD-10-CM | POA: Insufficient documentation

## 2020-09-29 DIAGNOSIS — K623 Rectal prolapse: Secondary | ICD-10-CM | POA: Insufficient documentation

## 2021-02-22 DIAGNOSIS — B3731 Acute candidiasis of vulva and vagina: Secondary | ICD-10-CM | POA: Insufficient documentation

## 2023-03-27 DIAGNOSIS — M1612 Unilateral primary osteoarthritis, left hip: Secondary | ICD-10-CM | POA: Insufficient documentation

## 2023-04-20 DIAGNOSIS — M533 Sacrococcygeal disorders, not elsewhere classified: Secondary | ICD-10-CM | POA: Insufficient documentation

## 2023-05-25 DIAGNOSIS — M7070 Other bursitis of hip, unspecified hip: Secondary | ICD-10-CM | POA: Insufficient documentation

## 2023-06-20 DIAGNOSIS — M7918 Myalgia, other site: Secondary | ICD-10-CM | POA: Insufficient documentation

## 2023-08-30 ENCOUNTER — Emergency Department (HOSPITAL_COMMUNITY): Payer: Medicare Other

## 2023-08-30 ENCOUNTER — Other Ambulatory Visit: Payer: Self-pay

## 2023-08-30 ENCOUNTER — Ambulatory Visit: Payer: Self-pay | Admitting: Student

## 2023-08-30 ENCOUNTER — Encounter (HOSPITAL_COMMUNITY): Payer: Self-pay

## 2023-08-30 ENCOUNTER — Inpatient Hospital Stay (HOSPITAL_COMMUNITY)
Admission: EM | Admit: 2023-08-30 | Discharge: 2023-09-04 | DRG: 481 | Disposition: A | Discharge status: Skilled Nursing Facility | Payer: Medicare Other | Attending: Internal Medicine | Admitting: Internal Medicine

## 2023-08-30 DIAGNOSIS — R55 Syncope and collapse: Secondary | ICD-10-CM | POA: Diagnosis present

## 2023-08-30 DIAGNOSIS — S0512XA Contusion of eyeball and orbital tissues, left eye, initial encounter: Secondary | ICD-10-CM | POA: Diagnosis present

## 2023-08-30 DIAGNOSIS — Z881 Allergy status to other antibiotic agents status: Secondary | ICD-10-CM

## 2023-08-30 DIAGNOSIS — S72002A Fracture of unspecified part of neck of left femur, initial encounter for closed fracture: Principal | ICD-10-CM | POA: Diagnosis present

## 2023-08-30 DIAGNOSIS — Y92000 Kitchen of unspecified non-institutional (private) residence as  the place of occurrence of the external cause: Secondary | ICD-10-CM

## 2023-08-30 DIAGNOSIS — Z90710 Acquired absence of both cervix and uterus: Secondary | ICD-10-CM

## 2023-08-30 DIAGNOSIS — I959 Hypotension, unspecified: Secondary | ICD-10-CM | POA: Diagnosis not present

## 2023-08-30 DIAGNOSIS — S72092A Other fracture of head and neck of left femur, initial encounter for closed fracture: Secondary | ICD-10-CM

## 2023-08-30 DIAGNOSIS — M25552 Pain in left hip: Secondary | ICD-10-CM | POA: Diagnosis not present

## 2023-08-30 DIAGNOSIS — J9811 Atelectasis: Secondary | ICD-10-CM | POA: Diagnosis not present

## 2023-08-30 DIAGNOSIS — Z882 Allergy status to sulfonamides status: Secondary | ICD-10-CM

## 2023-08-30 DIAGNOSIS — I10 Essential (primary) hypertension: Secondary | ICD-10-CM | POA: Diagnosis present

## 2023-08-30 DIAGNOSIS — E871 Hypo-osmolality and hyponatremia: Secondary | ICD-10-CM | POA: Diagnosis present

## 2023-08-30 DIAGNOSIS — Z79890 Hormone replacement therapy (postmenopausal): Secondary | ICD-10-CM

## 2023-08-30 DIAGNOSIS — Z79899 Other long term (current) drug therapy: Secondary | ICD-10-CM

## 2023-08-30 DIAGNOSIS — K589 Irritable bowel syndrome without diarrhea: Secondary | ICD-10-CM | POA: Diagnosis present

## 2023-08-30 DIAGNOSIS — D62 Acute posthemorrhagic anemia: Secondary | ICD-10-CM | POA: Diagnosis not present

## 2023-08-30 DIAGNOSIS — E876 Hypokalemia: Secondary | ICD-10-CM | POA: Diagnosis present

## 2023-08-30 DIAGNOSIS — S72142A Displaced intertrochanteric fracture of left femur, initial encounter for closed fracture: Principal | ICD-10-CM | POA: Diagnosis present

## 2023-08-30 DIAGNOSIS — W19XXXA Unspecified fall, initial encounter: Secondary | ICD-10-CM | POA: Diagnosis present

## 2023-08-30 DIAGNOSIS — Z91040 Latex allergy status: Secondary | ICD-10-CM

## 2023-08-30 DIAGNOSIS — Z87891 Personal history of nicotine dependence: Secondary | ICD-10-CM

## 2023-08-30 DIAGNOSIS — R0902 Hypoxemia: Secondary | ICD-10-CM | POA: Diagnosis not present

## 2023-08-30 DIAGNOSIS — F419 Anxiety disorder, unspecified: Secondary | ICD-10-CM | POA: Diagnosis present

## 2023-08-30 DIAGNOSIS — M1990 Unspecified osteoarthritis, unspecified site: Secondary | ICD-10-CM | POA: Diagnosis present

## 2023-08-30 LAB — CBC WITH DIFFERENTIAL/PLATELET
Abs Immature Granulocytes: 0.06 10*3/uL (ref 0.00–0.07)
Basophils Absolute: 0 10*3/uL (ref 0.0–0.1)
Basophils Relative: 0 %
Eosinophils Absolute: 0 10*3/uL (ref 0.0–0.5)
Eosinophils Relative: 1 %
HCT: 34.8 % — ABNORMAL LOW (ref 36.0–46.0)
Hemoglobin: 11.6 g/dL — ABNORMAL LOW (ref 12.0–15.0)
Immature Granulocytes: 1 %
Lymphocytes Relative: 25 %
Lymphs Abs: 1.4 10*3/uL (ref 0.7–4.0)
MCH: 30.6 pg (ref 26.0–34.0)
MCHC: 33.3 g/dL (ref 30.0–36.0)
MCV: 91.8 fL (ref 80.0–100.0)
Monocytes Absolute: 0.4 10*3/uL (ref 0.1–1.0)
Monocytes Relative: 7 %
Neutro Abs: 3.8 10*3/uL (ref 1.7–7.7)
Neutrophils Relative %: 66 %
Platelets: 197 10*3/uL (ref 150–400)
RBC: 3.79 MIL/uL — ABNORMAL LOW (ref 3.87–5.11)
RDW: 12.4 % (ref 11.5–15.5)
WBC: 5.8 10*3/uL (ref 4.0–10.5)
nRBC: 0 % (ref 0.0–0.2)

## 2023-08-30 MED ORDER — MORPHINE SULFATE (PF) 4 MG/ML IV SOLN
4.0000 mg | Freq: Once | INTRAVENOUS | Status: AC
  Administered 2023-08-31: 2 mg via INTRAVENOUS
  Filled 2023-08-30 (×2): qty 1

## 2023-08-30 MED ORDER — ONDANSETRON HCL 4 MG/2ML IJ SOLN
INTRAMUSCULAR | Status: AC
  Filled 2023-08-30: qty 2

## 2023-08-30 MED ORDER — SODIUM CHLORIDE 0.9 % IV BOLUS
1000.0000 mL | Freq: Once | INTRAVENOUS | Status: AC
  Administered 2023-08-30: 1000 mL via INTRAVENOUS

## 2023-08-30 MED ORDER — ONDANSETRON HCL 4 MG/2ML IJ SOLN
4.0000 mg | Freq: Once | INTRAMUSCULAR | Status: AC
  Administered 2023-08-30: 4 mg via INTRAVENOUS

## 2023-08-30 NOTE — ED Provider Notes (Signed)
[place] EMERGENCY DEPARTMENT AT [facility] Provider Note   CSN: [id number] Arrival date & time: 08/30/23  2134     History  Chief Complaint  Patient presents with   Fall   Hip Pain    Katherine Garrison is a 82 y.o. female history of arthritis, hypertension here presenting with fall.  Patient apparently had a syncopal event and fell.  Family was in the other room and heard a thump and noticed that Katherine Garrison was on the floor.  Katherine Garrison was noted to have obvious deformity of the left hip.  Patient has been having problems with the left hip for several years now.  Patient had seen Dr. Lequita Halt from Ortho for the hip.  The history is provided by the patient.       Home Medications Prior to Admission medications   Medication Sig Start Date End Date Taking? Authorizing Provider  amLODipine (NORVASC) 2.5 MG tablet Take 2.5 mg by mouth daily.    [provider]  busPIRone (BUSPAR) 15 MG tablet Take 15 mg by mouth 3 (three) times daily.    [provider]  Calcium-Vitamin D-Vitamin K (VIACTIV PO) Take by mouth.    [provider]  cephALEXin (KEFLEX) 500 MG capsule Take 1 capsule (500 mg total) by mouth 2 (two) times daily. 08/02/17   Tilden Fossa, MD  dicyclomine (BENTYL) 10 MG capsule Take 10 mg by mouth 4 (four) times daily -  before meals and at bedtime.    [provider]  estrogens, conjugated, (PREMARIN) 0.625 MG tablet Take 0.625 mg by mouth daily. Take daily for 21 days then do not take for 7 days.    [provider]  fluticasone (VERAMYST) 27.5 MCG/SPRAY nasal spray Place 2 sprays into the nose daily.    [provider]  lubiprostone (AMITIZA) 8 MCG capsule Take 8 mcg by mouth 2 (two) times daily with a meal.    [provider]  Multiple Vitamins-Minerals (WOMENS ONE DAILY PO) Take by mouth.    [provider]  ondansetron (ZOFRAN) 4 MG tablet Take 1 tablet (4 mg total) by mouth every 6 (six) hours as needed for  nausea or vomiting. 08/02/17   Tilden Fossa, MD  Polyethylene Glycol 3350 (MIRALAX PO) Take by mouth.    [provider]  Probiotic Product (ALIGN PO) Take by mouth.    [provider]  sertraline (ZOLOFT) 50 MG tablet Take 50 mg by mouth daily.    [provider]  valsartan-hydrochlorothiazide (DIOVAN-HCT) 320-25 MG per tablet Take 1 tablet by mouth daily.    [provider]      Allergies    Latex, Neosporin [neomycin-bacitracin zn-polymyx], and Sulfa antibiotics    Review of Systems   Review of Systems  Musculoskeletal:        Left hip pain  All other systems reviewed and are negative.   Physical Exam Updated Vital Signs BP 110/66 (BP Location: Left Arm)   Pulse 70   Temp 97.8 F (36.6 C) (Oral)   Resp 14   Ht 5\' 6"  (1.676 m)   Wt 58.2 kg   SpO2 98%   BMI 20.72 kg/m  Physical Exam Vitals and nursing note reviewed.  Constitutional:      Comments: Uncomfortable  HENT:     Head:     Comments: Patient has frontal scalp hematoma    Nose: Nose normal.     Mouth/Throat:     Mouth: Mucous membranes are moist.  Eyes:     Extraocular Movements: Extraocular movements intact.     Pupils: Pupils are equal, round, and reactive to light.  Cardiovascular:     Rate and Rhythm: Normal rate and regular rhythm.     Pulses: Normal pulses.     Heart sounds: Normal heart sounds.  Pulmonary:     Effort: Pulmonary effort is normal.     Breath sounds: Normal breath sounds.  Abdominal:     General: Abdomen is flat.     Palpations: Abdomen is soft.  Musculoskeletal:     Cervical back: Normal range of motion and neck supple.     Comments: Obvious deformity of the left hip.  Patient has no obvious left knee tenderness or deformity.  No other extremity trauma  Skin:    General: Skin is warm.     Capillary Refill: Capillary refill takes less than 2 seconds.  Neurological:     General: No focal deficit present.     Mental Status: Katherine Garrison is alert and  oriented to person, place, and time.  Psychiatric:        Mood and Affect: Mood normal.        Behavior: Behavior normal.     ED Results / Procedures / Treatments   Labs (all labs ordered are listed, but only abnormal results are displayed) Labs Reviewed  CBC WITH DIFFERENTIAL/PLATELET - Abnormal; Notable for the following components:      Result Value   RBC 3.79 (*)    Hemoglobin 11.6 (*)    HCT 34.8 (*)    All other components within normal limits  CBC WITH DIFFERENTIAL/PLATELET  COMPREHENSIVE METABOLIC PANEL  ETHANOL  TROPONIN I (HIGH SENSITIVITY)    EKG None  Radiology No results found.  Procedures Procedures    Medications Ordered in ED Medications  morphine (PF) 4 MG/ML injection 4 mg (has no administration in time range)  ondansetron (ZOFRAN) injection 4 mg (4 mg Intravenous Given 08/30/23 2201)    ED Course/ Medical Decision Making/ A&P                                 Medical Decision Making Katherine Garrison is a 82 y.o. female here presenting with left hip pain.  Patient fell and hit the left hip.  Patient apparently had a syncopal event prior to the fall.   11:09 PM Discussed with Dr. Linna Caprice from ortho.  He recommend medicine admission and n.p.o. after midnight.  Labs are pending and CT head and cervical spine are pending.  Signed out to Dr. Clayborne Dana.  Patient will need admission after labs and CT results   Amount and/or Complexity of Data Reviewed Labs: ordered. Radiology: ordered. ECG/medicine tests: ordered.  Risk Prescription drug management.    Final Clinical Impression(s) / ED Diagnoses Final diagnoses:  None    Rx / DC Orders ED Discharge Orders     None         Charlynne Pander, MD 08/30/23 2311

## 2023-08-30 NOTE — ED Notes (Signed)
Dr. Clayborne Dana is at bedside

## 2023-08-30 NOTE — ED Notes (Signed)
Patient transported to CT 

## 2023-08-30 NOTE — Consult Note (Signed)
ORTHOPAEDIC CONSULTATION  REQUESTING PHYSICIAN: Mesner, Barbara Cower, MD  PCP:  Loyal Jacobson, MD  Chief Complaint: Left hip pain, fall.   HPI: Katherine Garrison is a 82 y.o. female with arthritis, hypertension here presenting with fall.  Patient apparently had a syncopal event and fell.  Family was in the other room and heard a thump and noticed that she was on the floor. She does not remember exactly what happened. She has a left eye contusion and noted to have obvious deformity of the left hip. She reports pain in her left hip with movement. She denies any other pain/injury. She denies any tingling or numbness in LE bilaterally. X-rays showed a displaced left IT fracture. Orthopedics was consulted. Patient has been having problems with the left hip for several years now.  Patient has seen Dr. Lequita Halt for left hip.   She denies any use of blood thinners. She lives at home with her husband. She does not normally use any assistive devices. No significant history of DVT/PE.   Past Medical History:  Diagnosis Date   Hypertension    IBS (irritable bowel syndrome)    Past Surgical History:  Procedure Laterality Date   ABDOMINAL HYSTERECTOMY     Social History   Socioeconomic History   Marital status: Married    Spouse name: Not on file   Number of children: Not on file   Years of education: Not on file   Highest education level: Not on file  Occupational History   Not on file  Tobacco Use   Smoking status: Former   Smokeless tobacco: Never  Vaping Use   Vaping status: Never Used  Substance and Sexual Activity   Alcohol use: Yes    Comment: occasional   Drug use: No   Sexual activity: Not on file  Other Topics Concern   Not on file  Social History Narrative   Not on file   Social Determinants of Health   Financial Resource Strain: Not on file  Food Insecurity: Not on file  Transportation Needs: Not on file  Physical Activity: Not on file  Stress: Not on file  Social  Connections: Not on file   History reviewed. No pertinent family history. Allergies  Allergen Reactions   Latex    Neosporin [Neomycin-Bacitracin Zn-Polymyx] Rash   Sulfa Antibiotics Rash   Prior to Admission medications   Medication Sig Start Date End Date Taking? Authorizing Provider  amLODipine (NORVASC) 2.5 MG tablet Take 2.5 mg by mouth daily.    [provider]  busPIRone (BUSPAR) 15 MG tablet Take 15 mg by mouth 3 (three) times daily.    [provider]  Calcium-Vitamin D-Vitamin K (VIACTIV PO) Take by mouth.    [provider]  cephALEXin (KEFLEX) 500 MG capsule Take 1 capsule (500 mg total) by mouth 2 (two) times daily. 08/02/17   Tilden Fossa, MD  dicyclomine (BENTYL) 10 MG capsule Take 10 mg by mouth 4 (four) times daily -  before meals and at bedtime.    [provider]  estrogens, conjugated, (PREMARIN) 0.625 MG tablet Take 0.625 mg by mouth daily. Take daily for 21 days then do not take for 7 days.    [provider]  fluticasone (VERAMYST) 27.5 MCG/SPRAY nasal spray Place 2 sprays into the nose daily.    [provider]  lubiprostone (AMITIZA) 8 MCG capsule Take 8 mcg by mouth 2 (two) times daily with a meal.    [provider]  Multiple Vitamins-Minerals (WOMENS ONE DAILY PO) Take by mouth.    [provider]  ondansetron (ZOFRAN) 4 MG tablet Take 1 tablet (4 mg total) by mouth every 6 (six) hours as needed for nausea or vomiting. 08/02/17   Tilden Fossa, MD  Polyethylene Glycol 3350 (MIRALAX PO) Take by mouth.    [provider]  Probiotic Product (ALIGN PO) Take by mouth.    [provider]  sertraline (ZOLOFT) 50 MG tablet Take 50 mg by mouth daily.    [provider]  valsartan-hydrochlorothiazide (DIOVAN-HCT) 320-25 MG per tablet Take 1 tablet by mouth daily.    [provider]   DG Femur Portable Min 2 Views Left  Result Date: 08/30/2023 CLINICAL DATA:   Status post fall. EXAM: LEFT FEMUR PORTABLE 2 VIEWS COMPARISON:  None Available. FINDINGS: There is an acute, comminuted fracture deformity involving the inter trochanteric region of the proximal left femur. There is no evidence of dislocation. Soft tissue swelling is seen at the previously noted fracture site. IMPRESSION: Acute, comminuted intertrochanteric fracture of the proximal left femur. Electronically Signed   By: Aram Candela M.D.   On: 08/30/2023 23:40   DG Knee Left Port  Result Date: 08/30/2023 CLINICAL DATA:  Status post fall. EXAM: PORTABLE LEFT KNEE - 1-2 VIEW COMPARISON:  None Available. FINDINGS: No evidence of fracture, dislocation, or joint effusion. Moderate severity tricompartmental joint space narrowing is seen. Mild medial and lateral chondrocalcinosis is also noted. There is mild medial soft tissue swelling. IMPRESSION: Moderate severity tricompartmental degenerative changes. Electronically Signed   By: Aram Candela M.D.   On: 08/30/2023 23:39   CT Cervical Spine Wo Contrast  Result Date: 08/30/2023 CLINICAL DATA:  Status post fall. EXAM: CT CERVICAL SPINE WITHOUT CONTRAST TECHNIQUE: Multidetector CT imaging of the cervical spine was performed without intravenous contrast. Multiplanar CT image reconstructions were also generated. RADIATION DOSE REDUCTION: This exam was performed according to the departmental dose-optimization program which includes automated exposure control, adjustment of the mA and/or kV according to patient size and/or use of iterative reconstruction technique. COMPARISON:  None Available. FINDINGS: Alignment: Normal. Skull base and vertebrae: No acute fracture. Chronic and degenerative changes seen involving the body and tip of the dens, as well as the adjacent portion of the anterior arch of C1. Soft tissues and spinal canal: No prevertebral fluid or swelling. No visible canal hematoma. Disc levels: Marked severity endplate sclerosis, anterior  osteophyte formation and posterior bony spurring are seen at the levels of C4-C5, C5-C6, C6-C7 and C7-T1. There is marked severity narrowing of the anterior atlantoaxial articulation, with marked severity intervertebral disc space narrowing at the levels of C4-C5, C5-C6, C6-C7 and C7-T1. Bilateral marked severity multilevel facet joint hypertrophy is noted. Upper chest: There is mild to moderate severity biapical scarring and/or atelectasis. Other: None. IMPRESSION: Marked severity multilevel degenerative changes, as described above, without evidence of an acute fracture or subluxation. Electronically Signed   By: Aram Candela M.D.   On: 08/30/2023 23:37   CT HEAD WO CONTRAST ( )  Result Date: 08/30/2023 CLINICAL DATA:  Status post fall. EXAM: CT HEAD WITHOUT CONTRAST TECHNIQUE: Contiguous axial images were obtained from the base of the skull through the vertex without intravenous contrast. RADIATION DOSE REDUCTION: This exam was performed according to the departmental dose-optimization program which includes automated exposure control, adjustment of the mA and/or kV according to patient size and/or use of iterative reconstruction technique. COMPARISON:  May 13, 2020 FINDINGS: Brain: There is mild cerebral atrophy  with widening of the extra-axial spaces and ventricular dilatation. There are areas of decreased attenuation within the white matter tracts of the supratentorial brain, consistent with microvascular disease changes. 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 Vascular: No hyperdense vessel or unexpected calcification. Skull: Normal. Negative for fracture or focal lesion. Sinuses/Orbits: No acute finding. Other: None. IMPRESSION: 1. Generalized cerebral atrophy with mild, chronic white matter small vessel ischemic changes. 2. No acute intracranial abnormality. Electronically Signed   By: Aram Candela M.D.   On: 08/30/2023 23:33   DG Hip Port [place] or [place] Pelvis 1 View  Left  Result Date: 08/30/2023 CLINICAL DATA:  Status post fall. EXAM: DG HIP (WITH OR WITHOUT PELVIS) 1V PORT LEFT COMPARISON:  None Available. FINDINGS: There is an acute, comminuted fracture deformity extending through the inter trochanteric region of the proximal left femur. There is no evidence of dislocation. Mild to moderate severity degenerative changes seen involving both hips, in the form of joint space narrowing and acetabular sclerosis. IMPRESSION: Acute, comminuted intertrochanteric fracture of the proximal left femur. Electronically Signed   By: Aram Candela M.D.   On: 08/30/2023 23:31   DG Chest Port 1 View  Result Date: 08/30/2023 CLINICAL DATA:  Status post fall. EXAM: PORTABLE CHEST 1 VIEW COMPARISON:  October 31, 2019 FINDINGS: The heart size and mediastinal contours are within normal limits. Both lungs are clear. Partially calcified bilateral breast implants are noted. A chronic fracture deformity is seen involving the distal right clavicle, with a radiopaque intramedullary rod and fixation screws seen within the proximal to mid left humerus. There is mild S-shaped scoliosis of the thoracolumbar spine. IMPRESSION: Stable chronic and postoperative changes without evidence of acute cardiopulmonary disease. Electronically Signed   By: Aram Candela M.D.   On: 08/30/2023 23:30    Positive ROS: All other systems have been reviewed and were otherwise negative with the exception of those mentioned in the HPI and as above.  Physical Exam: General: Alert, no acute distress. C-collar intact. Contusion above left eye.  Cardiovascular: No pedal edema Respiratory: No cyanosis, no use of accessory musculature GI: No organomegaly, abdomen is soft and non-tender Skin: No lesions in the area of chief complaint Neurologic: Sensation intact distally Psychiatric: Patient is competent for consent with normal mood and affect Lymphatic: No axillary or cervical  lymphadenopathy  MUSCULOSKELETAL:   Examination of the left hip reveals no skin wounds or lesions. LLE shortened and externally rotated. Tenderness to palpation over the trochanteric region.   Sensory and motor function intact in LE bilaterally including plantar flexion, dorsiflexion, and EHL. Distal pedal pulses 2+ bilaterally. Capillary refill <2 seconds.   Examination of UE bilaterally showed no skin wounds or lesions except slight bruise on left shoulder. No TTP in UE bilaterally. Sensory and motor function intact in UE bilaterally. Radial and ulnar pulses 2+ bilaterally. Capillary refill <2 seconds.   Assessment: Comminuted left intertrochanteric femur fracture.   Plan: Patient has a displaced left hip fracture that will require surgical treatment for pain control and allow immediate mobilization out of bed. TRH to be consulted for admission and perioperative medical optimization, labs pending. Plan for left hip IM fixation with Dr. Aundria Rud tomorrow. Hold chemical DVT ppx. NPO after midnight tonight. All question solicited and answered.     Katherine Dupes, PA-C    08/30/2023 11:54 PM

## 2023-08-30 NOTE — ED Triage Notes (Addendum)
Patient BIB [facility] from home with complaint of syncope & fall & left hip pain.  Per EMS family witnessed fall. Family reported that patient LOC for 10-20 seconds. Did hit head on the left side. Patient had 1 glass wine tonight.   Left leg shortened and rotated out.  20g LFA  EMS administered: 150 mcg Fent  Patient presents to ER with bruising noted to the left eye, left shoulder, left leg shortened and rotated out. Patient placed on 2 L [place] due to O2 sat decreased to 88% on room air.

## 2023-08-30 NOTE — ED Provider Notes (Signed)
11:25 PM Assumed care from Dr. Silverio Lay, please see their note for full history, physical and decision making until this point. In brief this is a 82 y.o. year old female who presented to the ED tonight with Fall and Hip Pain     Syncopal episode with L hip pain.  Alucio pt, broke hip, spoke with swinteck, pending workup for syncope, NPO for surgery tomorrow.  Patient with an episode of hypotension but resolved pretty quickly still getting fluids.  On further evaluation soundly her syncope was probably orthostatic.  She did have some wine.  There was no prodrome though which does make a little bit more concerning.  Troponins negative, CBC is reassuring, EKG is reassuring, slightly low potassium and a slightly elevated anion gap but no evidence of DKA, alcoholic ketoacidosis or starvation ketoacidosis.  Will continue fluid hydration.  Will discuss with medicine for admission.   Labs, studies and imaging reviewed by myself and considered in medical decision making if ordered. Imaging interpreted by radiology.  Labs Reviewed  CBC WITH DIFFERENTIAL/PLATELET - Abnormal; Notable for the following components:      Result Value   RBC 3.79 (*)    Hemoglobin 11.6 (*)    HCT 34.8 (*)    All other components within normal limits  CBC WITH DIFFERENTIAL/PLATELET  ETHANOL  COMPREHENSIVE METABOLIC PANEL  TROPONIN I (HIGH SENSITIVITY)  TROPONIN I (HIGH SENSITIVITY)    DG Hip Port [place] or Wo Pelvis 1 View Left    (Results Pending)  DG Chest Port 1 View    (Results Pending)  CT HEAD WO CONTRAST ( )    (Results Pending)  CT Cervical Spine Wo Contrast    (Results Pending)  DG Knee Left Port    (Results Pending)  DG Femur Portable Min 2 Views Left    (Results Pending)    No follow-ups on file.    Dylin Breeden, Barbara Cower, MD 08/31/23 [place]

## 2023-08-30 NOTE — ED Notes (Signed)
Trauma Response Nurse Documentation   Katherine Garrison is a 82 y.o. female arriving to [facility] ED via [facility]  On No antithrombotic. Trauma was activated as a Level 2 by EDP based on the following trauma criteria GCS 10-14 associated with trauma or AVPU < A.  Patient cleared for CT by Dr. Clayborne Dana.  Level 2 Trauma activated after xray and CT scans completed.  GCS 15.  History   Past Medical History:  Diagnosis Date   Hypertension    IBS (irritable bowel syndrome)      Past Surgical History:  Procedure Laterality Date   ABDOMINAL HYSTERECTOMY         Initial Focused Assessment (If applicable, or please see trauma documentation): Airway-- intact, no visible obstruction Breathing-- spontaneous, unlabored Circulation-- no obvious bleeding noted  CT's Completed:   CT Head and CT C-Spine   Interventions:  See event summary  Plan for disposition:  {Trauma Dispo:26867}   Consults completed:  {Trauma Consults:26862} at ***.  Event Summary: On TRN arrival, initial assessment already completed. Patient xrays and CT scans already completed. Patient alert and oriented, GCS 15. RN reports patient with syncopal episode and became hypotensive, RN states same happened earlier causing patient to fall. 1 L bolus of fluids currently being administered and patient BP has returned to normal range. MTP Summary (If applicable):   Bedside handoff with ED RN Pearletha Forge.    Leota Sauers  Trauma Response RN  Please call TRN at [phone] for further assistance.

## 2023-08-30 NOTE — ED Notes (Signed)
Dr. Silverio Lay has seen Pt and confirmed hypotension.

## 2023-08-30 NOTE — ED Notes (Signed)
DO NOT GIVE MORE PAIN MEDS UNTIL REASSESSED. INCREASED REACTION TO FENTANYL ADMIN WITH PERIODS OF HYPOTENSION

## 2023-08-31 ENCOUNTER — Inpatient Hospital Stay (HOSPITAL_COMMUNITY): Payer: Medicare Other

## 2023-08-31 ENCOUNTER — Inpatient Hospital Stay (HOSPITAL_COMMUNITY): Payer: Medicare Other | Admitting: Anesthesiology

## 2023-08-31 ENCOUNTER — Ambulatory Visit: Payer: Self-pay | Admitting: Student

## 2023-08-31 ENCOUNTER — Encounter (HOSPITAL_COMMUNITY): Payer: Self-pay | Admitting: Family Medicine

## 2023-08-31 ENCOUNTER — Other Ambulatory Visit: Payer: Self-pay

## 2023-08-31 ENCOUNTER — Encounter (HOSPITAL_COMMUNITY)
Admission: EM | Disposition: A | Discharge status: Skilled Nursing Facility | Payer: Self-pay | Source: Home / Self Care | Attending: Internal Medicine

## 2023-08-31 DIAGNOSIS — S72002A Fracture of unspecified part of neck of left femur, initial encounter for closed fracture: Secondary | ICD-10-CM | POA: Diagnosis present

## 2023-08-31 DIAGNOSIS — S72092A Other fracture of head and neck of left femur, initial encounter for closed fracture: Secondary | ICD-10-CM | POA: Diagnosis not present

## 2023-08-31 DIAGNOSIS — D62 Acute posthemorrhagic anemia: Secondary | ICD-10-CM | POA: Diagnosis not present

## 2023-08-31 DIAGNOSIS — S0512XA Contusion of eyeball and orbital tissues, left eye, initial encounter: Secondary | ICD-10-CM | POA: Diagnosis present

## 2023-08-31 DIAGNOSIS — K589 Irritable bowel syndrome without diarrhea: Secondary | ICD-10-CM | POA: Diagnosis present

## 2023-08-31 DIAGNOSIS — Z87891 Personal history of nicotine dependence: Secondary | ICD-10-CM | POA: Diagnosis not present

## 2023-08-31 DIAGNOSIS — M25552 Pain in left hip: Secondary | ICD-10-CM | POA: Diagnosis present

## 2023-08-31 DIAGNOSIS — R55 Syncope and collapse: Secondary | ICD-10-CM

## 2023-08-31 DIAGNOSIS — F419 Anxiety disorder, unspecified: Secondary | ICD-10-CM

## 2023-08-31 DIAGNOSIS — Z79890 Hormone replacement therapy (postmenopausal): Secondary | ICD-10-CM | POA: Diagnosis not present

## 2023-08-31 DIAGNOSIS — Z90710 Acquired absence of both cervix and uterus: Secondary | ICD-10-CM | POA: Diagnosis not present

## 2023-08-31 DIAGNOSIS — S72142A Displaced intertrochanteric fracture of left femur, initial encounter for closed fracture: Secondary | ICD-10-CM | POA: Diagnosis present

## 2023-08-31 DIAGNOSIS — I10 Essential (primary) hypertension: Secondary | ICD-10-CM

## 2023-08-31 DIAGNOSIS — Z882 Allergy status to sulfonamides status: Secondary | ICD-10-CM | POA: Diagnosis not present

## 2023-08-31 DIAGNOSIS — I959 Hypotension, unspecified: Secondary | ICD-10-CM | POA: Diagnosis not present

## 2023-08-31 DIAGNOSIS — J9811 Atelectasis: Secondary | ICD-10-CM | POA: Diagnosis not present

## 2023-08-31 DIAGNOSIS — E876 Hypokalemia: Secondary | ICD-10-CM | POA: Diagnosis present

## 2023-08-31 DIAGNOSIS — E871 Hypo-osmolality and hyponatremia: Secondary | ICD-10-CM | POA: Diagnosis present

## 2023-08-31 DIAGNOSIS — Z91040 Latex allergy status: Secondary | ICD-10-CM | POA: Diagnosis not present

## 2023-08-31 DIAGNOSIS — W19XXXA Unspecified fall, initial encounter: Secondary | ICD-10-CM | POA: Diagnosis present

## 2023-08-31 DIAGNOSIS — R0902 Hypoxemia: Secondary | ICD-10-CM | POA: Diagnosis not present

## 2023-08-31 DIAGNOSIS — Z881 Allergy status to other antibiotic agents status: Secondary | ICD-10-CM | POA: Diagnosis not present

## 2023-08-31 DIAGNOSIS — Y92000 Kitchen of unspecified non-institutional (private) residence as  the place of occurrence of the external cause: Secondary | ICD-10-CM | POA: Diagnosis not present

## 2023-08-31 DIAGNOSIS — M1990 Unspecified osteoarthritis, unspecified site: Secondary | ICD-10-CM | POA: Diagnosis present

## 2023-08-31 DIAGNOSIS — Z79899 Other long term (current) drug therapy: Secondary | ICD-10-CM | POA: Diagnosis not present

## 2023-08-31 HISTORY — DX: Fracture of unspecified part of neck of left femur, initial encounter for closed fracture: S72.002A

## 2023-08-31 HISTORY — PX: INTRAMEDULLARY (IM) NAIL INTERTROCHANTERIC: SHX5875

## 2023-08-31 LAB — BASIC METABOLIC PANEL
Anion gap: 8 (ref 5–15)
BUN: 10 mg/dL (ref 8–23)
CO2: 22 mmol/L (ref 22–32)
Calcium: 8.2 mg/dL — ABNORMAL LOW (ref 8.9–10.3)
Chloride: 103 mmol/L (ref 98–111)
Creatinine, Ser: 0.54 mg/dL (ref 0.44–1.00)
GFR, Estimated: >60 mL/min (ref >60)
Glucose, Bld: 139 mg/dL — ABNORMAL HIGH (ref 70–99)
Potassium: 3.7 mmol/L (ref 3.5–5.1)
Sodium: 133 mmol/L — ABNORMAL LOW (ref 135–145)

## 2023-08-31 LAB — CBC
HCT: 32.8 % — ABNORMAL LOW (ref 36.0–46.0)
HCT: 31.9 % — ABNORMAL LOW (ref 36.0–46.0)
Hemoglobin: 10.7 g/dL — ABNORMAL LOW (ref 12.0–15.0)
Hemoglobin: 10.5 g/dL — ABNORMAL LOW (ref 12.0–15.0)
MCH: 30.1 pg (ref 26.0–34.0)
MCH: 30.7 pg (ref 26.0–34.0)
MCHC: 32.6 g/dL (ref 30.0–36.0)
MCHC: 32.9 g/dL (ref 30.0–36.0)
MCV: 92.1 fL (ref 80.0–100.0)
MCV: 93.3 fL (ref 80.0–100.0)
Platelets: 188 10*3/uL (ref 150–400)
Platelets: 191 10*3/uL (ref 150–400)
RBC: 3.56 MIL/uL — ABNORMAL LOW (ref 3.87–5.11)
RBC: 3.42 MIL/uL — ABNORMAL LOW (ref 3.87–5.11)
RDW: 12.5 % (ref 11.5–15.5)
RDW: 12.7 % (ref 11.5–15.5)
WBC: 9.9 10*3/uL (ref 4.0–10.5)
WBC: 10 10*3/uL (ref 4.0–10.5)
nRBC: 0 % (ref 0.0–0.2)
nRBC: 0 % (ref 0.0–0.2)

## 2023-08-31 LAB — COMPREHENSIVE METABOLIC PANEL
ALT: 19 U/L (ref 0–44)
AST: 27 U/L (ref 15–41)
Albumin: 3.2 g/dL — ABNORMAL LOW (ref 3.5–5.0)
Alkaline Phosphatase: 39 U/L (ref 38–126)
Anion gap: 20 — ABNORMAL HIGH (ref 5–15)
BUN: 12 mg/dL (ref 8–23)
CO2: 21 mmol/L — ABNORMAL LOW (ref 22–32)
Calcium: 8.5 mg/dL — ABNORMAL LOW (ref 8.9–10.3)
Chloride: 97 mmol/L — ABNORMAL LOW (ref 98–111)
Creatinine, Ser: 0.62 mg/dL (ref 0.44–1.00)
GFR, Estimated: >60 mL/min (ref >60)
Glucose, Bld: 132 mg/dL — ABNORMAL HIGH (ref 70–99)
Potassium: 3.3 mmol/L — ABNORMAL LOW (ref 3.5–5.1)
Sodium: 138 mmol/L (ref 135–145)
Total Bilirubin: 0.6 mg/dL (ref 0.3–1.2)
Total Protein: 5.7 g/dL — ABNORMAL LOW (ref 6.5–8.1)

## 2023-08-31 LAB — ABO/RH: ABO/RH(D): O POS

## 2023-08-31 LAB — ECHOCARDIOGRAM COMPLETE
Area-P 1/2: 3.77 cm2
Height: 65 in
S' Lateral: 2.3 cm
Weight: 2080 oz

## 2023-08-31 LAB — RETICULOCYTES
Immature Retic Fract: 12.5 % (ref 2.3–15.9)
RBC.: 3.37 MIL/uL — ABNORMAL LOW (ref 3.87–5.11)
Retic Count, Absolute: 54.3 10*3/uL (ref 19.0–186.0)
Retic Ct Pct: 1.6 % (ref 0.4–3.1)

## 2023-08-31 LAB — MAGNESIUM: Magnesium: 1.6 mg/dL — ABNORMAL LOW (ref 1.7–2.4)

## 2023-08-31 LAB — VITAMIN B12: Vitamin B-12: 660 pg/mL (ref 180–914)

## 2023-08-31 LAB — SURGICAL PCR SCREEN
MRSA, PCR: NEGATIVE
Staphylococcus aureus: NEGATIVE

## 2023-08-31 LAB — IRON AND TIBC
Iron: 29 ug/dL (ref 28–170)
Saturation Ratios: 11 % (ref 10.4–31.8)
TIBC: 274 ug/dL (ref 250–450)
UIBC: 245 ug/dL

## 2023-08-31 LAB — FOLATE: Folate: 31.4 ng/mL (ref >5.9)

## 2023-08-31 LAB — ETHANOL: Alcohol, Ethyl (B): 69 mg/dL — ABNORMAL HIGH (ref <10)

## 2023-08-31 LAB — CREATININE, SERUM
Creatinine, Ser: 0.74 mg/dL (ref 0.44–1.00)
GFR, Estimated: >60 mL/min (ref >60)

## 2023-08-31 LAB — TROPONIN I (HIGH SENSITIVITY): Troponin I (High Sensitivity): 5 ng/L (ref <18)

## 2023-08-31 LAB — FERRITIN: Ferritin: 315 ng/mL — ABNORMAL HIGH (ref 11–307)

## 2023-08-31 SURGERY — FIXATION, FRACTURE, INTERTROCHANTERIC, WITH INTRAMEDULLARY ROD
Anesthesia: General | Laterality: Left

## 2023-08-31 MED ORDER — HYDROCODONE-ACETAMINOPHEN 7.5-325 MG PO TABS: 1.0000 | ORAL_TABLET | ORAL | Status: DC | PRN

## 2023-08-31 MED ORDER — BUPIVACAINE-EPINEPHRINE (PF) 0.25% -1:200000 IJ SOLN
INTRAMUSCULAR | Status: AC
  Filled 2023-08-31: qty 30

## 2023-08-31 MED ORDER — PROPOFOL 10 MG/ML IV BOLUS
INTRAVENOUS | Status: DC | PRN
  Administered 2023-08-31: 80 mg via INTRAVENOUS

## 2023-08-31 MED ORDER — ADULT MULTIVITAMIN W/MINERALS CH
1.0000 | ORAL_TABLET | Freq: Every day | ORAL | Status: DC
  Administered 2023-09-01 – 2023-09-04 (×4): 1 via ORAL
  Filled 2023-08-31 (×4): qty 1

## 2023-08-31 MED ORDER — TRANEXAMIC ACID-NACL 1000-0.7 MG/100ML-% IV SOLN
INTRAVENOUS | Status: AC
  Filled 2023-08-31: qty 100

## 2023-08-31 MED ORDER — PROCHLORPERAZINE EDISYLATE 10 MG/2ML IJ SOLN
5.0000 mg | INTRAMUSCULAR | Status: DC | PRN
  Administered 2023-08-31: 5 mg via INTRAVENOUS
  Filled 2023-08-31: qty 2

## 2023-08-31 MED ORDER — CEFAZOLIN SODIUM-DEXTROSE 2-4 GM/100ML-% IV SOLN
2.0000 g | Freq: Four times a day (QID) | INTRAVENOUS | Status: AC
  Administered 2023-08-31 – 2023-09-01 (×2): 2 g via INTRAVENOUS
  Filled 2023-08-31 (×2): qty 100

## 2023-08-31 MED ORDER — METOCLOPRAMIDE HCL 10 MG PO TABS: 5.0000 mg | ORAL_TABLET | Freq: Three times a day (TID) | ORAL | Status: DC | PRN

## 2023-08-31 MED ORDER — LIDOCAINE 2% (20 MG/ML) 5 ML SYRINGE
INTRAMUSCULAR | Status: DC | PRN
  Administered 2023-08-31: 40 mg via INTRAVENOUS

## 2023-08-31 MED ORDER — BUPIVACAINE-EPINEPHRINE 0.25% -1:200000 IJ SOLN
INTRAMUSCULAR | Status: DC | PRN
Start: 2023-08-31 — End: 2023-08-31
  Administered 2023-08-31: 30 mL

## 2023-08-31 MED ORDER — OXYCODONE HCL 5 MG PO TABS
5.0000 mg | ORAL_TABLET | ORAL | Status: DC | PRN
  Administered 2023-08-31: 5 mg via ORAL
  Filled 2023-08-31: qty 1

## 2023-08-31 MED ORDER — LACTATED RINGERS IV SOLN: INTRAVENOUS | Status: DC

## 2023-08-31 MED ORDER — FENTANYL CITRATE (PF) 250 MCG/5ML IJ SOLN
INTRAMUSCULAR | Status: AC
  Filled 2023-08-31: qty 5

## 2023-08-31 MED ORDER — ROCURONIUM BROMIDE 10 MG/ML (PF) SYRINGE
PREFILLED_SYRINGE | INTRAVENOUS | Status: DC | PRN
  Administered 2023-08-31: 50 mg via INTRAVENOUS

## 2023-08-31 MED ORDER — ONDANSETRON HCL 4 MG/2ML IJ SOLN
INTRAMUSCULAR | Status: AC
  Filled 2023-08-31: qty 2

## 2023-08-31 MED ORDER — MORPHINE SULFATE (PF) 2 MG/ML IV SOLN
0.5000 mg | INTRAVENOUS | Status: DC | PRN
  Administered 2023-09-01: 1 mg via INTRAVENOUS

## 2023-08-31 MED ORDER — ONDANSETRON HCL 4 MG PO TABS: 4.0000 mg | ORAL_TABLET | Freq: Four times a day (QID) | ORAL | Status: DC | PRN

## 2023-08-31 MED ORDER — ENSURE ENLIVE PO LIQD
237.0000 mL | Freq: Two times a day (BID) | ORAL | Status: DC
  Administered 2023-09-01 – 2023-09-04 (×6): 237 mL via ORAL
  Filled 2023-08-31: qty 237

## 2023-08-31 MED ORDER — CEFAZOLIN SODIUM-DEXTROSE 2-4 GM/100ML-% IV SOLN
INTRAVENOUS | Status: AC
  Filled 2023-08-31: qty 100

## 2023-08-31 MED ORDER — SERTRALINE HCL 50 MG PO TABS
50.0000 mg | ORAL_TABLET | Freq: Every day | ORAL | Status: DC
  Administered 2023-09-01 – 2023-09-04 (×4): 50 mg via ORAL
  Filled 2023-08-31 (×4): qty 1

## 2023-08-31 MED ORDER — DEXAMETHASONE SODIUM PHOSPHATE 10 MG/ML IJ SOLN
INTRAMUSCULAR | Status: DC | PRN
  Administered 2023-08-31: 4 mg via INTRAVENOUS

## 2023-08-31 MED ORDER — ACETAMINOPHEN 500 MG PO TABS
ORAL_TABLET | ORAL | Status: AC
  Administered 2023-08-31: 1000 mg via ORAL
  Filled 2023-08-31: qty 2

## 2023-08-31 MED ORDER — PHENOL 1.4 % MT LIQD: 1.0000 | OROMUCOSAL | Status: DC | PRN

## 2023-08-31 MED ORDER — HYDROCODONE-ACETAMINOPHEN 5-325 MG PO TABS
1.0000 | ORAL_TABLET | ORAL | Status: DC | PRN
  Administered 2023-09-02: 2 via ORAL
  Administered 2023-09-02: 1 via ORAL
  Administered 2023-09-03 (×2): 2 via ORAL
  Filled 2023-08-31: qty 2
  Filled 2023-08-31: qty 1
  Filled 2023-08-31 (×2): qty 2

## 2023-08-31 MED ORDER — POVIDONE-IODINE 10 % EX SWAB
2.0000 | Freq: Once | CUTANEOUS | Status: AC
  Administered 2023-08-31: 2 via TOPICAL

## 2023-08-31 MED ORDER — HYDROCODONE-ACETAMINOPHEN 5-325 MG PO TABS: 1.0000 | ORAL_TABLET | ORAL | 0 refills | Status: DC | PRN

## 2023-08-31 MED ORDER — BUSPIRONE HCL 5 MG PO TABS
15.0000 mg | ORAL_TABLET | Freq: Three times a day (TID) | ORAL | Status: DC
  Administered 2023-08-31 – 2023-09-04 (×11): 15 mg via ORAL
  Filled 2023-08-31 (×10): qty 3
  Filled 2023-08-31: qty 2
  Filled 2023-08-31 (×2): qty 3

## 2023-08-31 MED ORDER — CALCIUM CARBONATE 1250 (500 CA) MG PO TABS
1.0000 | ORAL_TABLET | Freq: Two times a day (BID) | ORAL | Status: DC
  Administered 2023-09-01 – 2023-09-04 (×7): 1250 mg via ORAL
  Filled 2023-08-31 (×9): qty 1

## 2023-08-31 MED ORDER — PROPOFOL 10 MG/ML IV BOLUS
INTRAVENOUS | Status: AC
  Filled 2023-08-31: qty 20

## 2023-08-31 MED ORDER — ACETAMINOPHEN 500 MG PO TABS: 1000.0000 mg | ORAL_TABLET | Freq: Once | ORAL | Status: AC

## 2023-08-31 MED ORDER — POLYETHYLENE GLYCOL 3350 17 G PO PACK: 17.0000 g | PACK | Freq: Every day | ORAL | Status: DC | PRN

## 2023-08-31 MED ORDER — CEFAZOLIN SODIUM-DEXTROSE 2-4 GM/100ML-% IV SOLN
2.0000 g | INTRAVENOUS | Status: AC
  Administered 2023-08-31: 2 g via INTRAVENOUS

## 2023-08-31 MED ORDER — ACETAMINOPHEN 325 MG PO TABS
325.0000 mg | ORAL_TABLET | Freq: Four times a day (QID) | ORAL | Status: DC | PRN
  Administered 2023-09-01 – 2023-09-04 (×4): 650 mg via ORAL
  Filled 2023-08-31 (×5): qty 2

## 2023-08-31 MED ORDER — MAGNESIUM SULFATE 2 GM/50ML IV SOLN
2.0000 g | Freq: Once | INTRAVENOUS | Status: AC
  Administered 2023-08-31: 2 g via INTRAVENOUS
  Filled 2023-08-31: qty 50

## 2023-08-31 MED ORDER — ROCURONIUM BROMIDE 10 MG/ML (PF) SYRINGE
PREFILLED_SYRINGE | INTRAVENOUS | Status: AC
  Filled 2023-08-31: qty 10

## 2023-08-31 MED ORDER — LIDOCAINE 2% (20 MG/ML) 5 ML SYRINGE
INTRAMUSCULAR | Status: AC
  Filled 2023-08-31: qty 5

## 2023-08-31 MED ORDER — DOCUSATE SODIUM 100 MG PO CAPS
100.0000 mg | ORAL_CAPSULE | Freq: Two times a day (BID) | ORAL | Status: DC
  Administered 2023-08-31 – 2023-09-01 (×2): 100 mg via ORAL
  Filled 2023-08-31 (×2): qty 1

## 2023-08-31 MED ORDER — FENTANYL CITRATE (PF) 100 MCG/2ML IJ SOLN
INTRAMUSCULAR | Status: AC
  Filled 2023-08-31: qty 2

## 2023-08-31 MED ORDER — 0.9 % SODIUM CHLORIDE (POUR BTL) OPTIME
TOPICAL | Status: DC | PRN
  Administered 2023-08-31: 1000 mL

## 2023-08-31 MED ORDER — MENTHOL 3 MG MT LOZG: 1.0000 | LOZENGE | OROMUCOSAL | Status: DC | PRN

## 2023-08-31 MED ORDER — POTASSIUM CHLORIDE 10 MEQ/100ML IV SOLN
10.0000 meq | INTRAVENOUS | Status: AC
  Administered 2023-08-31 (×3): 10 meq via INTRAVENOUS
  Filled 2023-08-31 (×3): qty 100

## 2023-08-31 MED ORDER — MORPHINE SULFATE (PF) 2 MG/ML IV SOLN
0.5000 mg | INTRAVENOUS | Status: DC | PRN
  Administered 2023-08-31 (×2): 1 mg via INTRAVENOUS
  Filled 2023-08-31 (×3): qty 1

## 2023-08-31 MED ORDER — PHENYLEPHRINE 80 MCG/ML (10ML) SYRINGE FOR IV PUSH (FOR BLOOD PRESSURE SUPPORT)
PREFILLED_SYRINGE | INTRAVENOUS | Status: DC | PRN
  Administered 2023-08-31 (×2): 80 ug via INTRAVENOUS
  Administered 2023-08-31: 160 ug via INTRAVENOUS

## 2023-08-31 MED ORDER — SUGAMMADEX SODIUM 200 MG/2ML IV SOLN
INTRAVENOUS | Status: DC | PRN
  Administered 2023-08-31: 200 mg via INTRAVENOUS

## 2023-08-31 MED ORDER — CHLORHEXIDINE GLUCONATE 0.12 % MT SOLN: 15.0000 mL | Freq: Once | OROMUCOSAL | Status: AC

## 2023-08-31 MED ORDER — ACETAMINOPHEN 500 MG PO TABS
500.0000 mg | ORAL_TABLET | Freq: Four times a day (QID) | ORAL | Status: AC
  Administered 2023-08-31 – 2023-09-01 (×4): 500 mg via ORAL
  Filled 2023-08-31 (×4): qty 1

## 2023-08-31 MED ORDER — ONDANSETRON HCL 4 MG/2ML IJ SOLN
INTRAMUSCULAR | Status: DC | PRN
  Administered 2023-08-31: 4 mg via INTRAVENOUS

## 2023-08-31 MED ORDER — CHLORHEXIDINE GLUCONATE 4 % EX SOLN
60.0000 mL | Freq: Once | CUTANEOUS | Status: DC
  Administered 2023-08-31: 4 via TOPICAL

## 2023-08-31 MED ORDER — FENTANYL CITRATE (PF) 100 MCG/2ML IJ SOLN
25.0000 ug | INTRAMUSCULAR | Status: DC | PRN
  Administered 2023-08-31 (×2): 25 ug via INTRAVENOUS

## 2023-08-31 MED ORDER — FENTANYL CITRATE (PF) 250 MCG/5ML IJ SOLN
INTRAMUSCULAR | Status: DC | PRN
  Administered 2023-08-31 (×2): 50 ug via INTRAVENOUS

## 2023-08-31 MED ORDER — ONDANSETRON HCL 4 MG/2ML IJ SOLN
4.0000 mg | Freq: Four times a day (QID) | INTRAMUSCULAR | Status: DC | PRN
  Administered 2023-09-03: 4 mg via INTRAVENOUS
  Filled 2023-08-31 (×2): qty 2

## 2023-08-31 MED ORDER — METHOCARBAMOL 500 MG PO TABS
500.0000 mg | ORAL_TABLET | Freq: Four times a day (QID) | ORAL | Status: DC | PRN
  Administered 2023-08-31 – 2023-09-03 (×4): 500 mg via ORAL
  Filled 2023-08-31 (×4): qty 1

## 2023-08-31 MED ORDER — METOCLOPRAMIDE HCL 5 MG/ML IJ SOLN: 5.0000 mg | Freq: Three times a day (TID) | INTRAMUSCULAR | Status: DC | PRN

## 2023-08-31 MED ORDER — LACTATED RINGERS IV BOLUS
500.0000 mL | Freq: Once | INTRAVENOUS | Status: AC
  Administered 2023-08-31: 500 mL via INTRAVENOUS

## 2023-08-31 MED ORDER — ORAL CARE MOUTH RINSE
15.0000 mL | Freq: Once | OROMUCOSAL | Status: AC
  Administered 2023-08-31: 15 mL via OROMUCOSAL

## 2023-08-31 MED ORDER — AMLODIPINE BESYLATE 5 MG PO TABS: 2.5000 mg | ORAL_TABLET | Freq: Every day | ORAL | Status: DC

## 2023-08-31 MED ORDER — TRANEXAMIC ACID-NACL 1000-0.7 MG/100ML-% IV SOLN
1000.0000 mg | Freq: Once | INTRAVENOUS | Status: AC
  Administered 2023-08-31: 1000 mg via INTRAVENOUS
  Filled 2023-08-31: qty 100

## 2023-08-31 MED ORDER — TRANEXAMIC ACID-NACL 1000-0.7 MG/100ML-% IV SOLN
1000.0000 mg | INTRAVENOUS | Status: AC
  Administered 2023-08-31: 1000 mg via INTRAVENOUS

## 2023-08-31 MED ORDER — METHOCARBAMOL 1000 MG/10ML IJ SOLN: 500.0000 mg | Freq: Four times a day (QID) | INTRAVENOUS | Status: DC | PRN

## 2023-08-31 MED ORDER — ENOXAPARIN SODIUM 40 MG/0.4ML IJ SOSY
40.0000 mg | PREFILLED_SYRINGE | INTRAMUSCULAR | Status: DC
  Administered 2023-09-01 – 2023-09-04 (×4): 40 mg via SUBCUTANEOUS
  Filled 2023-08-31 (×4): qty 0.4

## 2023-08-31 MED ORDER — ONDANSETRON HCL 4 MG/2ML IJ SOLN
INTRAMUSCULAR | Status: AC
  Administered 2023-08-31: 4 mg via INTRAVENOUS
  Filled 2023-08-31: qty 2

## 2023-08-31 MED ORDER — FLUTICASONE PROPIONATE 50 MCG/ACT NA SUSP
1.0000 | Freq: Every day | NASAL | Status: DC
  Administered 2023-09-01 – 2023-09-04 (×4): 1 via NASAL
  Filled 2023-08-31: qty 16

## 2023-08-31 MED ORDER — LACTATED RINGERS IV SOLN: INTRAVENOUS | Status: AC

## 2023-08-31 MED ORDER — DEXAMETHASONE SODIUM PHOSPHATE 10 MG/ML IJ SOLN
INTRAMUSCULAR | Status: AC
  Filled 2023-08-31: qty 1

## 2023-08-31 SURGICAL SUPPLY — 47 items
ADH SKN CLS LQ APL DERMABOND (GAUZE/BANDAGES/DRESSINGS) ×1
ALCOHOL 70% 16 OZ (MISCELLANEOUS) ×2 IMPLANT
BAG COUNTER SPONGE SURGICOUNT (BAG) ×2 IMPLANT
BAG SPNG CNTER NS LX DISP (BAG) ×1
BIT DRILL CANN HP 16 (BIT) IMPLANT
BIT DRILL CANN STP 6/9 HIP (BIT) IMPLANT
BIT DRILL LONG 4.2 (BIT) IMPLANT
BNDG CMPR 5X6 CHSV STRCH STRL (GAUZE/BANDAGES/DRESSINGS) ×2
BNDG COHESIVE 6X5 TAN ST LF (GAUZE/BANDAGES/DRESSINGS) ×4 IMPLANT
CANISTER SUCT 3000ML PPV (MISCELLANEOUS) ×2 IMPLANT
CLSR STERI-STRIP ANTIMIC 1/2X4 (GAUZE/BANDAGES/DRESSINGS) IMPLANT
COVER PERINEAL POST (MISCELLANEOUS) ×2 IMPLANT
COVER SURGICAL LIGHT HANDLE (MISCELLANEOUS) ×2 IMPLANT
DERMABOND ADVANCED .7 DNX6 (GAUZE/BANDAGES/DRESSINGS) IMPLANT
DRAPE C-ARM 42X72 X-RAY (DRAPES) ×2 IMPLANT
DRAPE HALF SHEET 40X57 (DRAPES) IMPLANT
DRAPE INCISE IOBAN 66X45 STRL (DRAPES) ×2 IMPLANT
DRAPE STERI IOBAN 125X83 (DRAPES) ×2 IMPLANT
DRSG ADAPTIC 3X8 NADH LF (GAUZE/BANDAGES/DRESSINGS) ×2 IMPLANT
DRSG AQUACEL AG ADV 3.5X 6 (GAUZE/BANDAGES/DRESSINGS) IMPLANT
DRSG TEGADERM 4X4.5 CHG (GAUZE/BANDAGES/DRESSINGS) IMPLANT
DURAPREP 26ML APPLICATOR (WOUND CARE) ×2 IMPLANT
ELECT CAUTERY BLADE 6.4 (BLADE) ×2 IMPLANT
ELECT REM PT RETURN 9FT ADLT (ELECTROSURGICAL) ×1 IMPLANT
ELECTRODE REM PT RTRN 9FT ADLT (ELECTROSURGICAL) ×2 IMPLANT
GAUZE SPONGE 4X4 12PLY STRL (GAUZE/BANDAGES/DRESSINGS) IMPLANT
GAUZE SPONGE 4X4 12PLY STRL LF (GAUZE/BANDAGES/DRESSINGS) ×2 IMPLANT
GLOVE BIO SURGEON STRL SZ7.5 (GLOVE) ×4 IMPLANT
GLOVE BIOGEL PI IND STRL 8 (GLOVE) ×4 IMPLANT
GOWN STRL REUS W/ TWL LRG LVL3 (GOWN DISPOSABLE) ×2 IMPLANT
GOWN STRL REUS W/ TWL XL LVL3 (GOWN DISPOSABLE) ×4 IMPLANT
GOWN STRL REUS W/TWL LRG LVL3 (GOWN DISPOSABLE) ×1
GOWN STRL REUS W/TWL XL LVL3 (GOWN DISPOSABLE) ×2
GUIDEWIRE 3.2X400 (WIRE) IMPLANT
KIT BASIN OR (CUSTOM PROCEDURE TRAY) ×2 IMPLANT
KIT TURNOVER KIT B (KITS) ×2 IMPLANT
NAIL CANN TFNA 9MM/130 DEG TI (Nail) IMPLANT
NS IRRIG 1000ML POUR BTL (IV SOLUTION) ×2 IMPLANT
PACK GENERAL/GYN (CUSTOM PROCEDURE TRAY) ×2 IMPLANT
PAD ARMBOARD 7.5X6 YLW CONV (MISCELLANEOUS) ×4 IMPLANT
SCREW LOCK IM TI 5X32 (Screw) IMPLANT
SCREW TFNA HELICAL 105 (Screw) IMPLANT
STAPLER VISISTAT 35W (STAPLE) ×2 IMPLANT
SUT MON AB 2-0 CT1 36 (SUTURE) ×2 IMPLANT
TOWEL GREEN STERILE (TOWEL DISPOSABLE) ×2 IMPLANT
TOWEL GREEN STERILE FF (TOWEL DISPOSABLE) ×2 IMPLANT
WATER STERILE IRR 1000ML POUR (IV SOLUTION) ×2 IMPLANT

## 2023-08-31 NOTE — Progress Notes (Signed)
PROGRESS NOTE    Katherine Garrison  WNU:[id number] DOB: [birth year] DOA: 08/30/2023 PCP: Loyal Jacobson, MD   Brief Narrative: 82 year old with past medical history significant for hypertension, IBS, anxiety who presents with left lower extremity deformity pain after syncopal episode resulting in fall.  Patient has been heard a "thud", he found patient on the floor unconscious.  She regained consciousness 20 seconds after but does not remember how she fell.  Patient has had a prior history of syncope related to acute pain, nausea.  Patient has been doing well prior to this event.  Evaluation in the ED CT head and CT cervical spine negative.  Chest x-Hafford negative.  X-Peckinpaugh of the hip demonstrate acute comminuted left intertrochanteric femur fracture.  In the ED patient had an acute episode of hypotension in the setting of severe pain, nausea which resolved with IV fluids.     Assessment & Plan:   Principal Problem:   Closed left hip fracture, initial encounter [facility]) Active Problems:   Anxiety disorder   Essential hypertension   Syncope   Hypokalemia  1-Syncope, versus mild TBI: -Patient has a prior history of syncope neurally mediated by description.  She was in her usual state with no prior triggers or prodromal -LOC may have resulted from slip/fall with head injury. -Continue to monitor on Telemetry - ECHO. Normal EF 65 %, no regional wall motion abnormalities, grade 1 diastolic dysfunction.  -Troponin negative. EKG: sinus rhythm.  Monitor on telemetry.   2-Left hip fracture: -Orthopedic consulted, planning left IM fixation with Dr. Aundria Rud today.  Awaiting echo -ECHo normal EF, ok to proceed with Surgery.   3-Hypertension: -Plan to hold Diovan HCTZ preop and in the setting of hypotensive episode in the ED -Would hold Norvasc as well due to hypotensive episode in the ED>    Hypokalemia: -Replaced.   Mild Hyponatremia -On IV fluids.  -Monitor.   Hypomagnesemia:  -Replete IV>    Anemia:  -will check anemia panel.    Anxiety: -Continue Zoloft and BuSpar      Estimated body mass index is 21.63 kg/m as calculated from the following:   Height as of this encounter: 5\' 5"  (1.651 m).   Weight as of this encounter: 59 kg.   DVT prophylaxis: SCD Code Status: Full code Family Communication:husband at bedside.  Disposition Plan:  Status is: Inpatient Remains inpatient appropriate because: management of hip fracture.     Consultants:  Dr Aundria Rud  Procedures:  ECHO  Antimicrobials:    Subjective: She is alert, she doesn't know how she fell. Denies chest pain or dyspnea.    Objective: Vitals:   08/31/23 0500 08/31/23 0520 08/31/23 0530 08/31/23 0630  BP: 111/63 (!) 137/59 (!) 124/47 (!) 134/55  Pulse: 81 86 81 87  Resp: 17 18 14 17   Temp:  (!) 97.5 F (36.4 C)    TempSrc:  Oral    SpO2: 100% 100% 100% 100%  Weight:      Height:        Intake/Output Summary (Last 24 hours) at 08/31/2023 0750 Last data filed at 08/31/2023 0604 Gross per 24 hour  Intake --  Output 800 ml  Net -800 ml   Filed Weights   08/30/23 2147 08/30/23 2242  Weight: 58.2 kg 59 kg    Examination:  General exam: Appears calm and comfortable  Respiratory system: Clear to auscultation. Respiratory effort normal. Cardiovascular system: S1 & S2 heard, RRR.  Gastrointestinal system: Abdomen is nondistended, soft and nontender.  Central  nervous system: Alert and oriented. Extremities: left LE shorter.  Data Reviewed: I have personally reviewed following labs and imaging studies  CBC: Recent Labs  Lab 08/30/23 2150 08/31/23 0433  WBC 5.8 9.9  NEUTROABS 3.8  --   HGB 11.6* 10.7*  HCT 34.8* 32.8*  MCV 91.8 92.1  PLT 197 188   Basic Metabolic Panel: Recent Labs  Lab 08/30/23 2326 08/31/23 0433  NA 138 133*  K 3.3* 3.7  CL 97* 103  CO2 21* 22  GLUCOSE 132* 139*  BUN 12 10  CREATININE 0.62 0.54  CALCIUM 8.5* 8.2*  MG  --  1.6*   GFR: Estimated  Creatinine Clearance: 48.8 mL/min (by C-G formula based on SCr of 0.54 mg/dL). Liver Function Tests: Recent Labs  Lab 08/30/23 2326  AST 27  ALT 19  ALKPHOS 39  BILITOT 0.6  PROT 5.7*  ALBUMIN 3.2*   No results for input(s): "LIPASE", "AMYLASE" in the last 168 hours. No results for input(s): "AMMONIA" in the last 168 hours. Coagulation Profile: No results for input(s): "INR", "PROTIME" in the last 168 hours. Cardiac Enzymes: No results for input(s): "CKTOTAL", "CKMB", "CKMBINDEX", "TROPONINI" in the last 168 hours. BNP (last 3 results) No results for input(s): "PROBNP" in the last 8760 hours. HbA1C: No results for input(s): "HGBA1C" in the last 72 hours. CBG: No results for input(s): "GLUCAP" in the last 168 hours. Lipid Profile: No results for input(s): "CHOL", "HDL", "LDLCALC", "TRIG", "CHOLHDL", "LDLDIRECT" in the last 72 hours. Thyroid Function Tests: No results for input(s): "TSH", "T4TOTAL", "FREET4", "T3FREE", "THYROIDAB" in the last 72 hours. Anemia Panel: No results for input(s): "VITAMINB12", "FOLATE", "FERRITIN", "TIBC", "IRON", "RETICCTPCT" in the last 72 hours. Sepsis Labs: No results for input(s): "PROCALCITON", "LATICACIDVEN" in the last 168 hours.  No results found for this or any previous visit (from the past 240 hour(s)).       Radiology Studies: DG Femur Portable Min 2 Views Left  Result Date: 08/30/2023 CLINICAL DATA:  Status post fall. EXAM: LEFT FEMUR PORTABLE 2 VIEWS COMPARISON:  None Available. FINDINGS: There is an acute, comminuted fracture deformity involving the inter trochanteric region of the proximal left femur. There is no evidence of dislocation. Soft tissue swelling is seen at the previously noted fracture site. IMPRESSION: Acute, comminuted intertrochanteric fracture of the proximal left femur. Electronically Signed   By: Aram Candela M.D.   On: 08/30/2023 23:40   DG Knee Left Port  Result Date: 08/30/2023 CLINICAL DATA:  Status  post fall. EXAM: PORTABLE LEFT KNEE - 1-2 VIEW COMPARISON:  None Available. FINDINGS: No evidence of fracture, dislocation, or joint effusion. Moderate severity tricompartmental joint space narrowing is seen. Mild medial and lateral chondrocalcinosis is also noted. There is mild medial soft tissue swelling. IMPRESSION: Moderate severity tricompartmental degenerative changes. Electronically Signed   By: Aram Candela M.D.   On: 08/30/2023 23:39   CT Cervical Spine Wo Contrast  Result Date: 08/30/2023 CLINICAL DATA:  Status post fall. EXAM: CT CERVICAL SPINE WITHOUT CONTRAST TECHNIQUE: Multidetector CT imaging of the cervical spine was performed without intravenous contrast. Multiplanar CT image reconstructions were also generated. RADIATION DOSE REDUCTION: This exam was performed according to the departmental dose-optimization program which includes automated exposure control, adjustment of the mA and/or kV according to patient size and/or use of iterative reconstruction technique. COMPARISON:  None Available. FINDINGS: Alignment: Normal. Skull base and vertebrae: No acute fracture. Chronic and degenerative changes seen involving the body and tip of the dens, as well as  the adjacent portion of the anterior arch of C1. Soft tissues and spinal canal: No prevertebral fluid or swelling. No visible canal hematoma. Disc levels: Marked severity endplate sclerosis, anterior osteophyte formation and posterior bony spurring are seen at the levels of C4-C5, C5-C6, C6-C7 and C7-T1. There is marked severity narrowing of the anterior atlantoaxial articulation, with marked severity intervertebral disc space narrowing at the levels of C4-C5, C5-C6, C6-C7 and C7-T1. Bilateral marked severity multilevel facet joint hypertrophy is noted. Upper chest: There is mild to moderate severity biapical scarring and/or atelectasis. Other: None. IMPRESSION: Marked severity multilevel degenerative changes, as described above, without  evidence of an acute fracture or subluxation. Electronically Signed   By: Aram Candela M.D.   On: 08/30/2023 23:37   CT HEAD WO CONTRAST ( )  Result Date: 08/30/2023 CLINICAL DATA:  Status post fall. EXAM: CT HEAD WITHOUT CONTRAST TECHNIQUE: Contiguous axial images were obtained from the base of the skull through the vertex without intravenous contrast. RADIATION DOSE REDUCTION: This exam was performed according to the departmental dose-optimization program which includes automated exposure control, adjustment of the mA and/or kV according to patient size and/or use of iterative reconstruction technique. COMPARISON:  May 13, 2020 FINDINGS: Brain: There is mild cerebral atrophy with widening of the extra-axial spaces and ventricular dilatation. There are areas of decreased attenuation within the white matter tracts of the supratentorial brain, consistent with microvascular disease changes. 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 Vascular: No hyperdense vessel or unexpected calcification. Skull: Normal. Negative for fracture or focal lesion. Sinuses/Orbits: No acute finding. Other: None. IMPRESSION: 1. Generalized cerebral atrophy with mild, chronic white matter small vessel ischemic changes. 2. No acute intracranial abnormality. Electronically Signed   By: Aram Candela M.D.   On: 08/30/2023 23:33   DG Hip Port [place] W or [place] Pelvis 1 View Left  Result Date: 08/30/2023 CLINICAL DATA:  Status post fall. EXAM: DG HIP (WITH OR WITHOUT PELVIS) 1V PORT LEFT COMPARISON:  None Available. FINDINGS: There is an acute, comminuted fracture deformity extending through the inter trochanteric region of the proximal left femur. There is no evidence of dislocation. Mild to moderate severity degenerative changes seen involving both hips, in the form of joint space narrowing and acetabular sclerosis. IMPRESSION: Acute, comminuted intertrochanteric fracture of the proximal left femur.  Electronically Signed   By: Aram Candela M.D.   On: 08/30/2023 23:31   DG Chest Port 1 View  Result Date: 08/30/2023 CLINICAL DATA:  Status post fall. EXAM: PORTABLE CHEST 1 VIEW COMPARISON:  October 31, 2019 FINDINGS: The heart size and mediastinal contours are within normal limits. Both lungs are clear. Partially calcified bilateral breast implants are noted. A chronic fracture deformity is seen involving the distal right clavicle, with a radiopaque intramedullary rod and fixation screws seen within the proximal to mid left humerus. There is mild S-shaped scoliosis of the thoracolumbar spine. IMPRESSION: Stable chronic and postoperative changes without evidence of acute cardiopulmonary disease. Electronically Signed   By: Aram Candela M.D.   On: 08/30/2023 23:30        Scheduled Meds:  busPIRone  15 mg Oral TID   sertraline  50 mg Oral Daily   Continuous Infusions:  lactated ringers 125 mL/hr at 08/31/23 0145   methocarbamol (ROBAXIN) IV       LOS: 0 days    Time spent: 35 minutes    Sharyn Brilliant A Deniz Eskridge, MD [facility]   If 7PM-7AM, please contact night-coverage [email or web address]  08/31/2023, 7:50 AM

## 2023-08-31 NOTE — Anesthesia Preprocedure Evaluation (Addendum)
Anesthesia Evaluation  Patient identified by MRN, date of birth, ID band Patient awake    Reviewed: Allergy & Precautions, NPO status , Patient's Chart, lab work & pertinent test results  Airway Mallampati: III       Dental  (+) Teeth Intact, Dental Advisory Given   Pulmonary former smoker   breath sounds clear to auscultation       Cardiovascular hypertension, Pt. on medications  Rhythm:Regular Rate:Normal  Echo:  1. Left ventricular ejection fraction, by estimation, is 65 to 70%. The  left ventricle has normal function. The left ventricle has no regional  wall motion abnormalities. Left ventricular diastolic parameters are  consistent with Grade I diastolic  dysfunction (impaired relaxation).   2. Right ventricular systolic function is normal. The right ventricular  size is normal. There is normal pulmonary artery systolic pressure. The  estimated right ventricular systolic pressure is 30.2 mmHg.   3. The mitral valve is normal in structure. Trivial mitral valve  regurgitation.   4. The aortic valve is tricuspid. Aortic valve regurgitation is not  visualized.   5. The inferior vena cava is normal in size with greater than 50%  respiratory variability, suggesting right atrial pressure of 3 mmHg.     Neuro/Psych  PSYCHIATRIC DISORDERS Anxiety     negative neurological ROS     GI/Hepatic negative GI ROS, Neg liver ROS,,,  Endo/Other  negative endocrine ROS    Renal/GU negative Renal ROS     Musculoskeletal  (+) Arthritis ,    Abdominal   Peds  Hematology negative hematology ROS (+)   Anesthesia Other Findings   Reproductive/Obstetrics                             Anesthesia Physical Anesthesia Plan  ASA: 3  Anesthesia Plan: General   Post-op Pain Management: Minimal or no pain anticipated   Induction: Intravenous  PONV Risk Score and Plan: 4 or greater and Ondansetron and  Treatment may vary due to age or medical condition  Airway Management Planned: Oral ETT  Additional Equipment: None  Intra-op Plan:   Post-operative Plan: Extubation in OR  Informed Consent: I have reviewed the patients History and Physical, chart, labs and discussed the procedure including the risks, benefits and alternatives for the proposed anesthesia with the patient or authorized representative who has indicated his/her understanding and acceptance.     Dental advisory given  Plan Discussed with: CRNA  Anesthesia Plan Comments:        Anesthesia Quick Evaluation

## 2023-08-31 NOTE — Plan of Care (Signed)

## 2023-08-31 NOTE — Anesthesia Postprocedure Evaluation (Signed)
Anesthesia Post Note  Patient: Katherine Garrison  Procedure(s) Performed: INTRAMEDULLARY (IM) NAIL INTERTROCHANTERIC (Left)     Patient location during evaluation: PACU Anesthesia Type: General Level of consciousness: awake and alert Pain management: pain level controlled Vital Signs Assessment: post-procedure vital signs reviewed and stable Respiratory status: spontaneous breathing, nonlabored ventilation, respiratory function stable and patient connected to nasal cannula oxygen Cardiovascular status: blood pressure returned to baseline and stable Postop Assessment: no apparent nausea or vomiting Anesthetic complications: no  No notable events documented.  Last Vitals:  Vitals:   08/31/23 1545 08/31/23 1627  BP: (!) 140/65   Pulse: 81   Resp: 10   Temp:  36.7 C  SpO2: 96% 97%    Last Pain:  Vitals:   08/31/23 1530  TempSrc:   PainSc: 3                  Shelton Silvas

## 2023-08-31 NOTE — ED Notes (Signed)
Bedpan placed.

## 2023-08-31 NOTE — Brief Op Note (Signed)
08/30/2023 - 08/31/2023  2:23 PM  PATIENT:  Katherine Garrison  82 y.o. female  PRE-OPERATIVE DIAGNOSIS:  LEFT INTERTROCH FEMUR FRACTURE  POST-OPERATIVE DIAGNOSIS:  LEFT INTERTROCH FEMUR FRACTURE  PROCEDURE:  Procedure(s): INTRAMEDULLARY (IM) NAIL INTERTROCHANTERIC (Left)  SURGEON:  Surgeons and Role:    * Aundria Rud, Noah Delaine, MD - Primary  PHYSICIAN ASSISTANT: Dion Saucier, PA-c  ANESTHESIA:   local and general  EBL:  40 cc  BLOOD ADMINISTERED:none  DRAINS: none   LOCAL MEDICATIONS USED:  MARCAINE     SPECIMEN:  No Specimen  DISPOSITION OF SPECIMEN:  N/A  COUNTS:  YES  TOURNIQUET:  * No tourniquets in log *  DICTATION: .Note written in EPIC  PLAN OF CARE: Admit to inpatient   PATIENT DISPOSITION:  PACU - hemodynamically stable.   Delay start of Pharmacological VTE agent (>24hrs) due to surgical blood loss or risk of bleeding: not applicable

## 2023-08-31 NOTE — Progress Notes (Signed)
Initial Nutrition Assessment  DOCUMENTATION CODES:   Not applicable  INTERVENTION:   -Once diet is advanced, add:   -Ensure Enlive po BID, each supplement provides 350 kcal and 20 grams of protein.  -MVI with minerals daily  NUTRITION DIAGNOSIS:   Increased nutrient needs related to post-op healing as evidenced by estimated needs.  GOAL:   Patient will meet greater than or equal to 90% of their needs  MONITOR:   PO intake, Supplement acceptance, Diet advancement  REASON FOR ASSESSMENT:   Consult Assessment of nutrition requirement/status, Hip fracture protocol  ASSESSMENT:   Pt with medical history significant for hypertension, IBS, and anxiety who presents with left leg deformity after syncopal episode with fall.  Pt admitted with brief LOC (syncope vs mild TBI) and lt hip fracture.   Pt unavailable at time of visit. Attempted to speak with pt via call to hospital room phone, however, unable to reach. RD unable to obtain further nutrition-related history or complete nutrition-focused physical exam at this time.    Per orthopedics notes, plan for IMN today. Pt is currently NPO for procedure.   Reviewed wt hx; no wt loss noted.   Pt with increased nutritional needs for post-op healing. Pt would greatly benefit from addition of oral nutrition supplements.   Medications reviewed.   Labs reviewed: Na: 133.    Diet Order:   Diet Order             Diet NPO time specified Except for: Ice Chips, Sips with Meds  Diet effective now                   EDUCATION NEEDS:   No education needs have been identified at this time  Skin:  Skin Assessment: Reviewed RN Assessment  Last BM:  Uknown  Height:   Ht Readings from Last 1 Encounters:  08/30/23 5\' 5"  (1.651 m)    Weight:   Wt Readings from Last 1 Encounters:  08/30/23 59 kg    Ideal Body Weight:  56.8 kg  BMI:  Body mass index is 21.63 kg/m.  Estimated Nutritional Needs:   Kcal:   1550-1750  Protein:  75-90 grams  Fluid:  > 1.5 L    Levada Schilling, RD, LDN, CDCES Registered Dietitian II Certified Diabetes Care and Education Specialist Please refer to [facility] for RD and/or RD on-call/weekend/after hours pager

## 2023-08-31 NOTE — Progress Notes (Signed)
Echocardiogram 2D Echocardiogram has been performed.  Warren Lacy Xayden Linsey RDCS 08/31/2023, 11:48 AM

## 2023-08-31 NOTE — Op Note (Signed)
Date of Surgery: 08/31/2023  INDICATIONS: Ms. Casel is a 82 y.o.-year-old female who sustained a left hip fracture. The risks and benefits of the procedure discussed with the patient prior to the procedure and all questions were answered; consent was obtained.  PREOPERATIVE DIAGNOSIS: left hip fracture   POSTOPERATIVE DIAGNOSIS: Same   PROCEDURE: Treatment of intertrochanteric, pertrochanteric, subtrochanteric fracture with intramedullary implant. CPT [phone]   SURGEON: Kathi Der. Aundria Rud, M.D.   Assistant: Dion Saucier, PA-C  Assistant attestation:  PA Sharon Seller present for the entire procedure.  ANESTHESIA: general   IV FLUIDS AND URINE: See anesthesia record   ESTIMATED BLOOD LOSS: 50 cc  IMPLANTS: Synthes TFN a 9 mm short nail 105 mm proximal compression screw 32 x 5 mm distal interlock  DRAINS: None.   COMPLICATIONS: None.   DESCRIPTION OF PROCEDURE: The patient was brought to the operating room and placed supine on the operating table. The patient's leg had been signed prior to the procedure. The patient had the anesthesia placed by the anesthesiologist. The prep verification and incision time-outs were performed to confirm that this was the correct patient, site, side and location. The patient had an SCD on the opposite lower extremity. The patient did receive antibiotics prior to the incision and was re-dosed during the procedure as needed at indicated intervals. The patient was positioned on the fracture table with the table in traction and internal rotation to reduce the hip. The well leg was placed in a scissor position and all bony prominences were well-padded. The patient had the lower extremity prepped and draped in the standard surgical fashion. The incision was made 4 finger breadths superior to the greater trochanter. A guide pin was inserted into the tip of the greater trochanter under fluoroscopic guidance. An opening reamer was used to gain access to the femoral  canal. The nail length was measured and inserted down the femoral canal to its proper depth. The appropriate version of insertion for the lag screw was found under fluoroscopy. A pin was inserted up the femoral neck through the jig. The length of the lag screw was then measured. The lag screw was inserted as near to center-center in the head as possible. The leg was taken out of traction, then the compression screw was used to compress across the fracture. Compression was visualized on serial xrays.   We next turned our attention to the distal interlocking screw.  This was placed through the drill guide of the nail inserter.  A small incision was made overlying the lateral thigh at the screw site, and a tonsil was used to disect down to bone.  A drill pass was made through the jig and across the nail through both cortices.  This was measured, and the appropriate screw was placed under hand power and found to have good bite.    The wound was copiously irrigated with saline and the subcutaneous layer closed with 2.0 vicryl and the skin was reapproximated with staples. The wounds were cleaned and dried a final time and a sterile dressing was placed. The hip was taken through a range of motion at the end of the case under fluoroscopic imaging to visualize the approach-withdraw phenomenon and confirm implant length in the head. The patient was then awakened from anesthesia and taken to the recovery room in stable condition. All counts were correct at the end of the case.   POSTOPERATIVE PLAN: The patient will be weight bearing as tolerated and will return in 2 weeks  for staple removal and the patient will receive DVT prophylaxis based on other medications, activity level, and risk ratio of bleeding to thrombosis.  Recommendation will be for twice daily 81 mg aspirin x 6 weeks.   Maryan Rued, MD [facility] [place] [phone] 2:38 PM

## 2023-08-31 NOTE — Anesthesia Procedure Notes (Addendum)
Procedure Name: Intubation Date/Time: 08/31/2023 1:48 PM  Performed by: Waynard Edwards, CRNAPre-anesthesia Checklist: Patient identified, Emergency Drugs available, Suction available and Patient being monitored Patient Re-evaluated:Patient Re-evaluated prior to induction Oxygen Delivery Method: Circle system utilized Preoxygenation: Pre-oxygenation with 100% oxygen Induction Type: IV induction Ventilation: Mask ventilation without difficulty Laryngoscope Size: Mac and 4 Grade View: Grade II Tube type: Oral Tube size: 7.0 mm Number of attempts: 2 Airway Equipment and Method: Stylet Placement Confirmation: ETT inserted through vocal cords under direct vision, positive ETCO2 and breath sounds checked- equal and bilateral Secured at: 21 cm Tube secured with: Tape Dental Injury: Teeth and Oropharynx as per pre-operative assessment  Comments: DL x 1 with Mil 2 and grade 2b view.  Unable to pass ETT anteriorly. DL x 2 by MD with grade 2 b view.  Successful intubation with EBBS.

## 2023-08-31 NOTE — Discharge Instructions (Signed)
Orthopedic surgery discharge instructions:  -Okay for full weightbearing as tolerated to the left lower extremity.  -Maintain postoperative bandages until follow-up appointment in 2 weeks with Dr. Aundria Rud.  -Apply ice liberally throughout the day to the left hip as well as take Tylenol and Advil around-the-clock for mild to moderate pain.  For any breakthrough pain use Norco as necessary.  -For the prevention of deep vein thrombosis (DVT), take an 81 mg aspirin twice per day x 6 weeks.   Follow with Primary MD Loyal Jacobson, MD in 7 days   Get CBC, CMP,  Magnesium -  checked next visit with your primary MD or SNF MD   Activity: Left leg weightbearing as tolerated with Full fall precautions use walker/cane & assistance as needed  Disposition SNF  Diet: Heart Healthy   Special Instructions: If you have smoked or chewed Tobacco  in the last 2 yrs please stop smoking, stop any regular Alcohol  and or any Recreational drug use.  On your next visit with your primary care physician please Get Medicines reviewed and adjusted.  Please request your Prim.MD to go over all Hospital Tests and Procedure/Radiological results at the follow up, please get all Hospital records sent to your Prim MD by signing hospital release before you go home.  If you experience worsening of your admission symptoms, develop shortness of breath, life threatening emergency, suicidal or homicidal thoughts you must seek medical attention immediately by calling 911 or calling your MD immediately  if symptoms less severe.  You Must read complete instructions/literature along with all the possible adverse reactions/side effects for all the Medicines you take and that have been prescribed to you. Take any new Medicines after you have completely understood and accpet all the possible adverse reactions/side effects.   Do not drive when taking Pain medications.  Do not take more than prescribed Pain, Sleep and Anxiety  Medications

## 2023-08-31 NOTE — H&P (Signed)
History and Physical    Katherine Garrison QIO:[id number] DOB: 1941/06/13 DOA: 08/30/2023  PCP: Loyal Jacobson, MD   Patient coming from: Home   Chief Complaint: Brief LOC, fall, LLE deformity   HPI: Katherine Garrison is a 82 y.o. female with medical history significant for hypertension, IBS, and anxiety who presents to the ED with left leg deformity after syncopal episode with fall.  Patient was in her usual state, had just finished dinner, and was standing in her kitchen when her husband heard a "thud" and saw the patient on the floor unconscious.  She regained awareness after 10 to 20 seconds but does not remember how she fell.  She denies any recent chest pain or palpitations.  Her husband noted that she was wearing socks on a hardwood floor and wonders if she may have slipped.  She has a history of prior syncopal episodes in the setting of acute pain or nausea, but had reportedly been well just prior to this event.  ED Course: Upon arrival to the ED, patient is found to be afebrile and saturating 100% on 2 L/min of supplemental oxygen with normal heart rate and normal blood pressure.  Labs are most notable for potassium 3.3, normal WBC, normal troponin, and ethanol level 69.  No acute findings noted on CT head or CT cervical spine.  Chest x-Zumbro is negative for acute cardiopulmonary disease.  Plain radiographs of the hip demonstrate acute comminuted left intertrochanteric femur fracture.  Patient developed an acute episode of hypotension in the ED in the setting of severe pain and nausea which resolved initially with IV fluids but has recurred.  Orthopedic surgery was consulted with ED physician and the patient was treated with IV fluids, morphine, and Zofran.  Review of Systems:  All other systems reviewed and apart from HPI, are negative.  Past Medical History:  Diagnosis Date   Hypertension    IBS (irritable bowel syndrome)     Past Surgical History:  Procedure Laterality Date   ABDOMINAL  HYSTERECTOMY      Social History:   reports that she has quit smoking. She has never used smokeless tobacco. She reports current alcohol use. She reports that she does not use drugs.  Allergies  Allergen Reactions   Latex    Neosporin [Neomycin-Bacitracin Zn-Polymyx] Rash   Sulfa Antibiotics Rash    History reviewed. No pertinent family history.   Prior to Admission medications   Medication Sig Start Date End Date Taking? Authorizing Provider  fluticasone (FLONASE) 50 MCG/ACT nasal spray  07/04/22  Yes [provider]  potassium chloride (KLOR-CON M) 10 MEQ tablet Take 10 mEq by mouth daily. 07/04/22  Yes [provider]  amLODipine (NORVASC) 2.5 MG tablet Take 2.5 mg by mouth daily.    [provider]  busPIRone (BUSPAR) 15 MG tablet Take 15 mg by mouth 3 (three) times daily.    [provider]  Calcium-Vitamin D-Vitamin K (VIACTIV PO) Take by mouth.    [provider]  cephALEXin (KEFLEX) 500 MG capsule Take 1 capsule (500 mg total) by mouth 2 (two) times daily. 08/02/17   Tilden Fossa, MD  dicyclomine (BENTYL) 10 MG capsule Take 10 mg by mouth 4 (four) times daily -  before meals and at bedtime.    [provider]  estrogens, conjugated, (PREMARIN) 0.625 MG tablet Take 0.625 mg by mouth daily. Take daily for 21 days then do not take for 7 days.    [provider]  fluticasone (  VERAMYST) 27.5 MCG/SPRAY nasal spray Place 2 sprays into the nose daily.    [provider]  lubiprostone (AMITIZA) 8 MCG capsule Take 8 mcg by mouth 2 (two) times daily with a meal.    [provider]  Multiple Vitamins-Minerals (WOMENS ONE DAILY PO) Take by mouth.    [provider]  ondansetron (ZOFRAN) 4 MG tablet Take 1 tablet (4 mg total) by mouth every 6 (six) hours as needed for nausea or vomiting. 08/02/17   Tilden Fossa, MD  Polyethylene Glycol 3350 (MIRALAX PO) Take by mouth.    [provider]   Probiotic Product (ALIGN PO) Take by mouth.    [provider]  sertraline (ZOLOFT) 50 MG tablet Take 50 mg by mouth daily.    [provider]  valsartan-hydrochlorothiazide (DIOVAN-HCT) 320-25 MG per tablet Take 1 tablet by mouth daily.    [provider]    Physical Exam: Vitals:   08/30/23 2345 08/31/23 0000 08/31/23 0015 08/31/23 0030  BP: 139/72 (!) 148/83 (!) 158/78 120/60  Pulse: 71 82 78 71  Resp: 18 (!) 23 17 18   Temp:      TempSrc:      SpO2: 100% 100% 100% 100%  Weight:      Height:        Constitutional: NAD, no pallor or diaphoresis  Eyes: PERTLA, lids and conjunctivae normal ENMT: Mucous membranes are dry. Posterior pharynx clear of any exudate or lesions.   Neck: supple, no masses  Respiratory: no wheezing, no crackles. No accessory muscle use.  Cardiovascular: S1 & S2 heard, regular rate and rhythm. No extremity edema.   Abdomen: No distension, no tenderness, soft. Bowel sounds active.  Musculoskeletal: no clubbing / cyanosis. Left hip tender; neurovascularly intact.   Skin: no significant rashes, lesions, ulcers. Warm, dry, well-perfused. Neurologic: CN 2-12 grossly intact. Moving all extremities. Alert and oriented.  Psychiatric: Calm. Cooperative.    Labs and Imaging on Admission: I have personally reviewed following labs and imaging studies  CBC: Recent Labs  Lab 08/30/23 2150  WBC 5.8  NEUTROABS 3.8  HGB 11.6*  HCT 34.8*  MCV 91.8  PLT 197   Basic Metabolic Panel: Recent Labs  Lab 08/30/23 2326  NA 138  K 3.3*  CL 97*  CO2 21*  GLUCOSE 132*  BUN 12  CREATININE 0.62  CALCIUM 8.5*   GFR: Estimated Creatinine Clearance: 48.8 mL/min (by C-G formula based on SCr of 0.62 mg/dL). Liver Function Tests: Recent Labs  Lab 08/30/23 2326  AST 27  ALT 19  ALKPHOS 39  BILITOT 0.6  PROT 5.7*  ALBUMIN 3.2*   No results for input(s): "LIPASE", "AMYLASE" in the last 168 hours. No results for input(s): "AMMONIA" in  the last 168 hours. Coagulation Profile: No results for input(s): "INR", "PROTIME" in the last 168 hours. Cardiac Enzymes: No results for input(s): "CKTOTAL", "CKMB", "CKMBINDEX", "TROPONINI" in the last 168 hours. BNP (last 3 results) No results for input(s): "PROBNP" in the last 8760 hours. HbA1C: No results for input(s): "HGBA1C" in the last 72 hours. CBG: No results for input(s): "GLUCAP" in the last 168 hours. Lipid Profile: No results for input(s): "CHOL", "HDL", "LDLCALC", "TRIG", "CHOLHDL", "LDLDIRECT" in the last 72 hours. Thyroid Function Tests: No results for input(s): "TSH", "T4TOTAL", "FREET4", "T3FREE", "THYROIDAB" in the last 72 hours. Anemia Panel: No results for input(s): "VITAMINB12", "FOLATE", "FERRITIN", "TIBC", "IRON", "RETICCTPCT" in the last 72 hours. Urine analysis:    Component Value Date/Time   COLORURINE  YELLOW 08/02/2017 0959   APPEARANCEUR TURBID (A) 08/02/2017 0959   LABSPEC 1.017 08/02/2017 0959   PHURINE 7.5 08/02/2017 0959   GLUCOSEU NEGATIVE 08/02/2017 0959   HGBUR NEGATIVE 08/02/2017 0959   BILIRUBINUR NEGATIVE 08/02/2017 0959   KETONESUR NEGATIVE 08/02/2017 0959   PROTEINUR 30 (A) 08/02/2017 0959   NITRITE NEGATIVE 08/02/2017 0959   LEUKOCYTESUR SMALL (A) 08/02/2017 0959   Sepsis Labs: @LABRCNTIP (procalcitonin:4,lacticidven:4) )No results found for this or any previous visit (from the past 240 hour(s)).   Radiological Exams on Admission: DG Femur Portable Min 2 Views Left  Result Date: 08/30/2023 CLINICAL DATA:  Status post fall. EXAM: LEFT FEMUR PORTABLE 2 VIEWS COMPARISON:  None Available. FINDINGS: There is an acute, comminuted fracture deformity involving the inter trochanteric region of the proximal left femur. There is no evidence of dislocation. Soft tissue swelling is seen at the previously noted fracture site. IMPRESSION: Acute, comminuted intertrochanteric fracture of the proximal left femur. Electronically Signed   By: Aram Candela M.D.   On: 08/30/2023 23:40   DG Knee Left Port  Result Date: 08/30/2023 CLINICAL DATA:  Status post fall. EXAM: PORTABLE LEFT KNEE - 1-2 VIEW COMPARISON:  None Available. FINDINGS: No evidence of fracture, dislocation, or joint effusion. Moderate severity tricompartmental joint space narrowing is seen. Mild medial and lateral chondrocalcinosis is also noted. There is mild medial soft tissue swelling. IMPRESSION: Moderate severity tricompartmental degenerative changes. Electronically Signed   By: Aram Candela M.D.   On: 08/30/2023 23:39   CT Cervical Spine Wo Contrast  Result Date: 08/30/2023 CLINICAL DATA:  Status post fall. EXAM: CT CERVICAL SPINE WITHOUT CONTRAST TECHNIQUE: Multidetector CT imaging of the cervical spine was performed without intravenous contrast. Multiplanar CT image reconstructions were also generated. RADIATION DOSE REDUCTION: This exam was performed according to the departmental dose-optimization program which includes automated exposure control, adjustment of the mA and/or kV according to patient size and/or use of iterative reconstruction technique. COMPARISON:  None Available. FINDINGS: Alignment: Normal. Skull base and vertebrae: No acute fracture. Chronic and degenerative changes seen involving the body and tip of the dens, as well as the adjacent portion of the anterior arch of C1. Soft tissues and spinal canal: No prevertebral fluid or swelling. No visible canal hematoma. Disc levels: Marked severity endplate sclerosis, anterior osteophyte formation and posterior bony spurring are seen at the levels of C4-C5, C5-C6, C6-C7 and C7-T1. There is marked severity narrowing of the anterior atlantoaxial articulation, with marked severity intervertebral disc space narrowing at the levels of C4-C5, C5-C6, C6-C7 and C7-T1. Bilateral marked severity multilevel facet joint hypertrophy is noted. Upper chest: There is mild to moderate severity biapical scarring and/or atelectasis.  Other: None. IMPRESSION: Marked severity multilevel degenerative changes, as described above, without evidence of an acute fracture or subluxation. Electronically Signed   By: Aram Candela M.D.   On: 08/30/2023 23:37   CT HEAD WO CONTRAST ( )  Result Date: 08/30/2023 CLINICAL DATA:  Status post fall. EXAM: CT HEAD WITHOUT CONTRAST TECHNIQUE: Contiguous axial images were obtained from the base of the skull through the vertex without intravenous contrast. RADIATION DOSE REDUCTION: This exam was performed according to the departmental dose-optimization program which includes automated exposure control, adjustment of the mA and/or kV according to patient size and/or use of iterative reconstruction technique. COMPARISON:  May 13, 2020 FINDINGS: Brain: There is mild cerebral atrophy with widening of the extra-axial spaces and ventricular dilatation. There are areas of decreased attenuation within the white matter tracts of the  supratentorial brain, consistent with microvascular disease changes. 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 Vascular: No hyperdense vessel or unexpected calcification. Skull: Normal. Negative for fracture or focal lesion. Sinuses/Orbits: No acute finding. Other: None. IMPRESSION: 1. Generalized cerebral atrophy with mild, chronic white matter small vessel ischemic changes. 2. No acute intracranial abnormality. Electronically Signed   By: Aram Candela M.D.   On: 08/30/2023 23:33   DG Hip Port [place] W or [place] Pelvis 1 View Left  Result Date: 08/30/2023 CLINICAL DATA:  Status post fall. EXAM: DG HIP (WITH OR WITHOUT PELVIS) 1V PORT LEFT COMPARISON:  None Available. FINDINGS: There is an acute, comminuted fracture deformity extending through the inter trochanteric region of the proximal left femur. There is no evidence of dislocation. Mild to moderate severity degenerative changes seen involving both hips, in the form of joint space narrowing and acetabular  sclerosis. IMPRESSION: Acute, comminuted intertrochanteric fracture of the proximal left femur. Electronically Signed   By: Aram Candela M.D.   On: 08/30/2023 23:31   DG Chest Port 1 View  Result Date: 08/30/2023 CLINICAL DATA:  Status post fall. EXAM: PORTABLE CHEST 1 VIEW COMPARISON:  October 31, 2019 FINDINGS: The heart size and mediastinal contours are within normal limits. Both lungs are clear. Partially calcified bilateral breast implants are noted. A chronic fracture deformity is seen involving the distal right clavicle, with a radiopaque intramedullary rod and fixation screws seen within the proximal to mid left humerus. There is mild S-shaped scoliosis of the thoracolumbar spine. IMPRESSION: Stable chronic and postoperative changes without evidence of acute cardiopulmonary disease. Electronically Signed   By: Aram Candela M.D.   On: 08/30/2023 23:30    EKG: Independently reviewed. Sinus rhythm, non-specific IVCD, QTc 500.   Assessment/Plan   1. Brief LOC   - Syncope vs mild TBI   - She has hx of syncopal episodes that are likely neurally-mediated by her description but was in usual state with no trigger or prodrome for this event and the LOC may have resulted from slip/fall with head injury  - Continue cardiac monitoring, check echocardiogram   2. Left hip fracture  - Continue pain-control and supportive care, keep NPO, hold ARB perioperatively   - If cardiac monitoring and echocardiogram are reassuring, Katherine Garrison would present  an estimated 0.2% risk of perioperative MI or cardiac arrest   3. Hypertension  - She was hypotensive in ED and antihypertensives held on admission   4. Hypokalemia  - Replacing, will repeat chem panel in am    5. Anxiety  - Continue Zoloft and Buspar    DVT prophylaxis: SCDs  Code Status: Full  Level of Care: Level of care: Telemetry Medical Family Communication: Daughter at bedside   Disposition Plan:  Patient is from: home  Anticipated  d/c is to: TBD Anticipated d/c date is: 09/04/23 Patient currently: Pending EKG, echocardiogram, cardiac monitoring, orthopedic surgery consultation, and likely operative hip repair  Consults called: orthopedic surgery  Admission status: Inpatient     Briscoe Deutscher, MD [facility]  08/31/2023, 12:52 AM

## 2023-08-31 NOTE — H&P (Signed)
H&P update  The surgical history has been reviewed and remains accurate without interval change.  The patient was re-examined and patient's physiologic condition has not changed significantly in the last 30 days. The condition still exists that makes this procedure necessary. The treatment plan remains the same, without new options for care.  No new pharmacological allergies or types of therapy has been initiated that would change the plan or the appropriateness of the plan.  The patient and/or family understand the potential benefits and risks.  She has had preoperative assessment and workup which did include echocardiogram.  We appreciate the hospitalist service for their diligent work on this matter.  Plan to proceed with surgical intervention today for left hip IM nail.  Marsh Heckler P. Aundria Rud, MD 08/31/2023 12:02 PM

## 2023-08-31 NOTE — Transfer of Care (Signed)
Immediate Anesthesia Transfer of Care Note  Patient: BIANKA SUNDERMEYER  Procedure(s) Performed: INTRAMEDULLARY (IM) NAIL INTERTROCHANTERIC (Left)  Patient Location: PACU  Anesthesia Type:General  Level of Consciousness: drowsy  Airway & Oxygen Therapy: Patient Spontanous Breathing  Post-op Assessment: Report given to RN and Post -op Vital signs reviewed and stable  Post vital signs: Reviewed and stable  Last Vitals:  Vitals Value Taken Time  BP 131/84 08/31/23 1500  Temp    Pulse 93 08/31/23 1500  Resp 18 08/31/23 1500  SpO2 95 % 08/31/23 1500  Vitals shown include unfiled device data.  Last Pain:  Vitals:   08/31/23 0557  TempSrc:   PainSc: 5          Complications: No notable events documented.

## 2023-08-31 NOTE — ED Notes (Signed)
Upon receiving the pt had a foley cath that was draining in place prior to this Rn care.

## 2023-08-31 NOTE — Progress Notes (Signed)
Orthopedic Tech Progress Note Patient Details:  Katherine Garrison 1941-12-04 [id number]  Level 2 trauma   Patient ID: Katherine Garrison, female   DOB: 1941/08/23, 82 y.o.   MRN: [id number]  Katherine Garrison 08/31/2023, 12:16 AM

## 2023-09-01 DIAGNOSIS — S72002A Fracture of unspecified part of neck of left femur, initial encounter for closed fracture: Secondary | ICD-10-CM | POA: Diagnosis not present

## 2023-09-01 LAB — CBC
HCT: 27.1 % — ABNORMAL LOW (ref 36.0–46.0)
Hemoglobin: 9.1 g/dL — ABNORMAL LOW (ref 12.0–15.0)
MCH: 32 pg (ref 26.0–34.0)
MCHC: 33.6 g/dL (ref 30.0–36.0)
MCV: 95.4 fL (ref 80.0–100.0)
Platelets: 155 10*3/uL (ref 150–400)
RBC: 2.84 MIL/uL — ABNORMAL LOW (ref 3.87–5.11)
RDW: 12.8 % (ref 11.5–15.5)
WBC: 6.9 10*3/uL (ref 4.0–10.5)
nRBC: 0 % (ref 0.0–0.2)

## 2023-09-01 LAB — BASIC METABOLIC PANEL
Anion gap: 6 (ref 5–15)
BUN: 6 mg/dL — ABNORMAL LOW (ref 8–23)
CO2: 28 mmol/L (ref 22–32)
Calcium: 8.3 mg/dL — ABNORMAL LOW (ref 8.9–10.3)
Chloride: 103 mmol/L (ref 98–111)
Creatinine, Ser: 0.6 mg/dL (ref 0.44–1.00)
GFR, Estimated: >60 mL/min (ref >60)
Glucose, Bld: 120 mg/dL — ABNORMAL HIGH (ref 70–99)
Potassium: 4.1 mmol/L (ref 3.5–5.1)
Sodium: 137 mmol/L (ref 135–145)

## 2023-09-01 MED ORDER — SENNOSIDES-DOCUSATE SODIUM 8.6-50 MG PO TABS
1.0000 | ORAL_TABLET | Freq: Two times a day (BID) | ORAL | Status: DC
  Administered 2023-09-01 – 2023-09-04 (×7): 1 via ORAL
  Filled 2023-09-01 (×6): qty 1

## 2023-09-01 MED ORDER — ORAL CARE MOUTH RINSE: 15.0000 mL | OROMUCOSAL | Status: DC | PRN

## 2023-09-01 MED ORDER — HYDRALAZINE HCL 20 MG/ML IJ SOLN: 5.0000 mg | Freq: Four times a day (QID) | INTRAMUSCULAR | Status: DC | PRN

## 2023-09-01 MED ORDER — POLYETHYLENE GLYCOL 3350 17 G PO PACK
34.0000 g | PACK | Freq: Once | ORAL | Status: AC
  Administered 2023-09-01: 34 g via ORAL
  Filled 2023-09-01: qty 2

## 2023-09-01 NOTE — Progress Notes (Signed)
PROGRESS NOTE    Katherine Garrison  HYQ:[id number] DOB: 1941/01/03 DOA: 08/30/2023 PCP: Katherine Jacobson, MD   Brief Narrative: 82 year old with past medical history significant for hypertension, IBS, anxiety who presents with left lower extremity deformity pain after syncopal episode resulting in fall.  Patient has been heard a "thud", he found patient on the floor unconscious.  She regained consciousness 20 seconds after but does not remember how she fell.  Patient has had a prior history of Garrison related to acute pain, nausea.  Patient has been doing well prior to this event.  Evaluation in the ED CT head and CT cervical spine negative.  Chest x-Chestnutt negative.  X-Whetstone of the hip demonstrate acute comminuted left intertrochanteric femur fracture.  In the ED patient had an acute episode of hypotension in the setting of severe pain, nausea which resolved with IV fluids.     Assessment & Plan:   Principal Problem:   Closed left hip fracture, initial encounter [facility]) Active Problems:   Anxiety disorder   Essential hypertension   Garrison   Hypokalemia  Garrison, versus mild TBI: -Patient has a prior history of Garrison neurally mediated by description.  She was in her usual state with no prior triggers or prodromal -LOC may have resulted from slip/fall with head injury. -Continue to monitor on Telemetry - ECHO. Normal EF 65 %, no regional wall motion abnormalities, grade 1 diastolic dysfunction.  -Troponin negative. EKG: sinus rhythm.  Monitor on telemetry.  No significant events so far  Left hip fracture: -Orthopedic consulted, this post left IM fixation with Dr. Aundria Garrison today.   -PT/OT consult pending -Continue with as needed pain medications  -Continue with subcu Lovenox for DVT prophylaxis  Hypertension: -For blood pressure seems soft to acceptable off home medications including 1, hydrochlorothiazide and Norvasc . -Keep on as needed hydralazine, and resume home meds gradually if blood  pressure started to increase.  Plan to hold Diovan HCTZ preop and in the setting of hypotensive episode in the ED -Would hold Norvasc as well due to hypotensive episode in the ED>    Hypokalemia: -Replaced.   Mild Hyponatremia -On IV fluids.  -Monitor.   Hypomagnesemia:  -Replete IV>   Anemia:  -will check anemia panel.    Anxiety: -Continue Zoloft and BuSpar      Estimated body mass index is 21.63 kg/m as calculated from the following:   Height as of this encounter: 5\' 5"  (1.651 m).   Weight as of this encounter: 59 kg.   DVT prophylaxis: SCD, Lovenox Code Status: Full code Family Communication: Discussed with daughter at bedside Disposition Plan:  Status is: Inpatient Remains inpatient appropriate because: management of hip fracture.     Consultants:  Orthopedic Dr Katherine Garrison  Procedures:  ECHO  Antimicrobials:    Subjective: No significant events overnight, she denies any complaints  Objective: Vitals:   09/01/23 0024 09/01/23 0400 09/01/23 0751 09/01/23 1112  BP: 112/61 (!) 117/58 112/78   Pulse: 79 76 79   Resp: 13 13 14    Temp: (!) 97.5 F (36.4 C) 97.6 F (36.4 C) 98.1 F (36.7 C) 98.6 F (37 C)  TempSrc: Oral Oral  Oral  SpO2: 97% 98% 99%   Weight:      Height:        Intake/Output Summary (Last 24 hours) at 09/01/2023 1156 Last data filed at 09/01/2023 0748 Gross per 24 hour  Intake 920 ml  Output 2275 ml  Net -1355 ml   [employer]  C-arm fluoroscopic images were obtained intraoperatively and submitted for post operative interpretation. Images demonstrate placement of left hip ORIF hardware traversing intertrochanteric fracture with improved alignment. 45 seconds of fluoroscopy time utilized. Radiation dose: 5.56 mGy. Please see the performing provider's procedural report for further detail. IMPRESSION: Intraoperative fluoroscopic guidance for left hip ORIF. Electronically Signed   By: Duanne Garrison D.O.   On: 08/31/2023 18:29   DG C-Arm 1-60 Min-No Report  Result Date: 08/31/2023 Fluoroscopy was utilized by the requesting physician.  No radiographic interpretation.   ECHOCARDIOGRAM COMPLETE  Result Date: 08/31/2023    ECHOCARDIOGRAM REPORT   Patient Name:   Katherine Garrison Date of Exam: 08/31/2023 Medical Rec #:  [id number]   Height:       65.0 in Accession #:    [id number]  Weight:       130.0 lb Date of Birth:  [birth year]  BSA:          1.647 m Patient Age:    81 years    BP:           134/56 mmHg Patient Gender: F           HR:           93 bpm. Exam Location:  Inpatient Procedure: 2D Echo, Color Doppler and Cardiac Doppler Indications:    Katherine Garrison, Pre-op clearance  History:        Patient has no prior history of Echocardiogram examinations.                 Risk  Factors:Hypertension.  Sonographer:    Katherine Garrison Senior RDCS Referring Phys: [id number] Katherine Garrison  Sonographer Comments: Scanned supine due to hip fracture IMPRESSIONS  1. Left ventricular ejection fraction, by estimation, is 65 to 70%. The left ventricle has normal function. The left ventricle has no regional wall motion abnormalities. Left ventricular diastolic parameters are consistent with Grade I diastolic dysfunction (impaired relaxation).  2. Right ventricular systolic function is normal. The right ventricular size is normal. There is normal pulmonary artery systolic pressure. The estimated right ventricular systolic pressure is 30.2 mmHg.  3. The mitral valve is normal in structure. Trivial mitral valve regurgitation.  4. The aortic valve is tricuspid. Aortic valve regurgitation is not visualized.  5. The inferior vena cava is normal in size with greater than 50% respiratory variability, suggesting right atrial pressure of 3 mmHg. Comparison(s): No prior Echocardiogram. FINDINGS  Left Ventricle: Left ventricular ejection fraction, by estimation, is 65 to 70%. The left ventricle has normal function. The left ventricle has no regional wall motion abnormalities. The left ventricular internal cavity size was normal in size. There is  no left ventricular hypertrophy. Left ventricular diastolic parameters are consistent with Grade I diastolic dysfunction (impaired relaxation). Indeterminate filling pressures. Right Ventricle: The right ventricular size is normal. No increase in right ventricular wall thickness. Right ventricular systolic function is normal. There is normal pulmonary artery systolic pressure. The tricuspid regurgitant velocity is 2.61 m/s, and  with an assumed right atrial pressure of 3 mmHg, the estimated right ventricular systolic pressure is 30.2 mmHg. Left Atrium: Left atrial size was normal in size. Right Atrium: Right atrial size was normal in size. Pericardium: There is no evidence of  pericardial effusion. Mitral Valve: The mitral valve is normal in structure. Trivial mitral valve regurgitation. Tricuspid Valve: The tricuspid valve is normal in structure. Tricuspid valve regurgitation is mild. Aortic Valve: The aortic valve is tricuspid. Aortic valve regurgitation is  PROGRESS NOTE    Katherine Garrison  HYQ:[id number] DOB: 1941/01/03 DOA: 08/30/2023 PCP: Katherine Jacobson, MD   Brief Narrative: 82 year old with past medical history significant for hypertension, IBS, anxiety who presents with left lower extremity deformity pain after syncopal episode resulting in fall.  Patient has been heard a "thud", he found patient on the floor unconscious.  She regained consciousness 20 seconds after but does not remember how she fell.  Patient has had a prior history of Garrison related to acute pain, nausea.  Patient has been doing well prior to this event.  Evaluation in the ED CT head and CT cervical spine negative.  Chest x-Chestnutt negative.  X-Whetstone of the hip demonstrate acute comminuted left intertrochanteric femur fracture.  In the ED patient had an acute episode of hypotension in the setting of severe pain, nausea which resolved with IV fluids.     Assessment & Plan:   Principal Problem:   Closed left hip fracture, initial encounter [facility]) Active Problems:   Anxiety disorder   Essential hypertension   Garrison   Hypokalemia  Garrison, versus mild TBI: -Patient has a prior history of Garrison neurally mediated by description.  She was in her usual state with no prior triggers or prodromal -LOC may have resulted from slip/fall with head injury. -Continue to monitor on Telemetry - ECHO. Normal EF 65 %, no regional wall motion abnormalities, grade 1 diastolic dysfunction.  -Troponin negative. EKG: sinus rhythm.  Monitor on telemetry.  No significant events so far  Left hip fracture: -Orthopedic consulted, this post left IM fixation with Dr. Aundria Garrison today.   -PT/OT consult pending -Continue with as needed pain medications  -Continue with subcu Lovenox for DVT prophylaxis  Hypertension: -For blood pressure seems soft to acceptable off home medications including 1, hydrochlorothiazide and Norvasc . -Keep on as needed hydralazine, and resume home meds gradually if blood  pressure started to increase.  Plan to hold Diovan HCTZ preop and in the setting of hypotensive episode in the ED -Would hold Norvasc as well due to hypotensive episode in the ED>    Hypokalemia: -Replaced.   Mild Hyponatremia -On IV fluids.  -Monitor.   Hypomagnesemia:  -Replete IV>   Anemia:  -will check anemia panel.    Anxiety: -Continue Zoloft and BuSpar      Estimated body mass index is 21.63 kg/m as calculated from the following:   Height as of this encounter: 5\' 5"  (1.651 m).   Weight as of this encounter: 59 kg.   DVT prophylaxis: SCD, Lovenox Code Status: Full code Family Communication: Discussed with daughter at bedside Disposition Plan:  Status is: Inpatient Remains inpatient appropriate because: management of hip fracture.     Consultants:  Orthopedic Dr Katherine Garrison  Procedures:  ECHO  Antimicrobials:    Subjective: No significant events overnight, she denies any complaints  Objective: Vitals:   09/01/23 0024 09/01/23 0400 09/01/23 0751 09/01/23 1112  BP: 112/61 (!) 117/58 112/78   Pulse: 79 76 79   Resp: 13 13 14    Temp: (!) 97.5 F (36.4 C) 97.6 F (36.4 C) 98.1 F (36.7 C) 98.6 F (37 C)  TempSrc: Oral Oral  Oral  SpO2: 97% 98% 99%   Weight:      Height:        Intake/Output Summary (Last 24 hours) at 09/01/2023 1156 Last data filed at 09/01/2023 0748 Gross per 24 hour  Intake 920 ml  Output 2275 ml  Net -1355 ml   [employer]  PROGRESS NOTE    Katherine Garrison  HYQ:[id number] DOB: 1941/01/03 DOA: 08/30/2023 PCP: Katherine Jacobson, MD   Brief Narrative: 82 year old with past medical history significant for hypertension, IBS, anxiety who presents with left lower extremity deformity pain after syncopal episode resulting in fall.  Patient has been heard a "thud", he found patient on the floor unconscious.  She regained consciousness 20 seconds after but does not remember how she fell.  Patient has had a prior history of Garrison related to acute pain, nausea.  Patient has been doing well prior to this event.  Evaluation in the ED CT head and CT cervical spine negative.  Chest x-Chestnutt negative.  X-Whetstone of the hip demonstrate acute comminuted left intertrochanteric femur fracture.  In the ED patient had an acute episode of hypotension in the setting of severe pain, nausea which resolved with IV fluids.     Assessment & Plan:   Principal Problem:   Closed left hip fracture, initial encounter [facility]) Active Problems:   Anxiety disorder   Essential hypertension   Garrison   Hypokalemia  Garrison, versus mild TBI: -Patient has a prior history of Garrison neurally mediated by description.  She was in her usual state with no prior triggers or prodromal -LOC may have resulted from slip/fall with head injury. -Continue to monitor on Telemetry - ECHO. Normal EF 65 %, no regional wall motion abnormalities, grade 1 diastolic dysfunction.  -Troponin negative. EKG: sinus rhythm.  Monitor on telemetry.  No significant events so far  Left hip fracture: -Orthopedic consulted, this post left IM fixation with Dr. Aundria Garrison today.   -PT/OT consult pending -Continue with as needed pain medications  -Continue with subcu Lovenox for DVT prophylaxis  Hypertension: -For blood pressure seems soft to acceptable off home medications including 1, hydrochlorothiazide and Norvasc . -Keep on as needed hydralazine, and resume home meds gradually if blood  pressure started to increase.  Plan to hold Diovan HCTZ preop and in the setting of hypotensive episode in the ED -Would hold Norvasc as well due to hypotensive episode in the ED>    Hypokalemia: -Replaced.   Mild Hyponatremia -On IV fluids.  -Monitor.   Hypomagnesemia:  -Replete IV>   Anemia:  -will check anemia panel.    Anxiety: -Continue Zoloft and BuSpar      Estimated body mass index is 21.63 kg/m as calculated from the following:   Height as of this encounter: 5\' 5"  (1.651 m).   Weight as of this encounter: 59 kg.   DVT prophylaxis: SCD, Lovenox Code Status: Full code Family Communication: Discussed with daughter at bedside Disposition Plan:  Status is: Inpatient Remains inpatient appropriate because: management of hip fracture.     Consultants:  Orthopedic Dr Katherine Garrison  Procedures:  ECHO  Antimicrobials:    Subjective: No significant events overnight, she denies any complaints  Objective: Vitals:   09/01/23 0024 09/01/23 0400 09/01/23 0751 09/01/23 1112  BP: 112/61 (!) 117/58 112/78   Pulse: 79 76 79   Resp: 13 13 14    Temp: (!) 97.5 F (36.4 C) 97.6 F (36.4 C) 98.1 F (36.7 C) 98.6 F (37 C)  TempSrc: Oral Oral  Oral  SpO2: 97% 98% 99%   Weight:      Height:        Intake/Output Summary (Last 24 hours) at 09/01/2023 1156 Last data filed at 09/01/2023 0748 Gross per 24 hour  Intake 920 ml  Output 2275 ml  Net -1355 ml   [employer]  PROGRESS NOTE    Katherine Garrison  HYQ:[id number] DOB: 1941/01/03 DOA: 08/30/2023 PCP: Katherine Jacobson, MD   Brief Narrative: 82 year old with past medical history significant for hypertension, IBS, anxiety who presents with left lower extremity deformity pain after syncopal episode resulting in fall.  Patient has been heard a "thud", he found patient on the floor unconscious.  She regained consciousness 20 seconds after but does not remember how she fell.  Patient has had a prior history of Garrison related to acute pain, nausea.  Patient has been doing well prior to this event.  Evaluation in the ED CT head and CT cervical spine negative.  Chest x-Chestnutt negative.  X-Whetstone of the hip demonstrate acute comminuted left intertrochanteric femur fracture.  In the ED patient had an acute episode of hypotension in the setting of severe pain, nausea which resolved with IV fluids.     Assessment & Plan:   Principal Problem:   Closed left hip fracture, initial encounter [facility]) Active Problems:   Anxiety disorder   Essential hypertension   Garrison   Hypokalemia  Garrison, versus mild TBI: -Patient has a prior history of Garrison neurally mediated by description.  She was in her usual state with no prior triggers or prodromal -LOC may have resulted from slip/fall with head injury. -Continue to monitor on Telemetry - ECHO. Normal EF 65 %, no regional wall motion abnormalities, grade 1 diastolic dysfunction.  -Troponin negative. EKG: sinus rhythm.  Monitor on telemetry.  No significant events so far  Left hip fracture: -Orthopedic consulted, this post left IM fixation with Dr. Aundria Garrison today.   -PT/OT consult pending -Continue with as needed pain medications  -Continue with subcu Lovenox for DVT prophylaxis  Hypertension: -For blood pressure seems soft to acceptable off home medications including 1, hydrochlorothiazide and Norvasc . -Keep on as needed hydralazine, and resume home meds gradually if blood  pressure started to increase.  Plan to hold Diovan HCTZ preop and in the setting of hypotensive episode in the ED -Would hold Norvasc as well due to hypotensive episode in the ED>    Hypokalemia: -Replaced.   Mild Hyponatremia -On IV fluids.  -Monitor.   Hypomagnesemia:  -Replete IV>   Anemia:  -will check anemia panel.    Anxiety: -Continue Zoloft and BuSpar      Estimated body mass index is 21.63 kg/m as calculated from the following:   Height as of this encounter: 5\' 5"  (1.651 m).   Weight as of this encounter: 59 kg.   DVT prophylaxis: SCD, Lovenox Code Status: Full code Family Communication: Discussed with daughter at bedside Disposition Plan:  Status is: Inpatient Remains inpatient appropriate because: management of hip fracture.     Consultants:  Orthopedic Dr Katherine Garrison  Procedures:  ECHO  Antimicrobials:    Subjective: No significant events overnight, she denies any complaints  Objective: Vitals:   09/01/23 0024 09/01/23 0400 09/01/23 0751 09/01/23 1112  BP: 112/61 (!) 117/58 112/78   Pulse: 79 76 79   Resp: 13 13 14    Temp: (!) 97.5 F (36.4 C) 97.6 F (36.4 C) 98.1 F (36.7 C) 98.6 F (37 C)  TempSrc: Oral Oral  Oral  SpO2: 97% 98% 99%   Weight:      Height:        Intake/Output Summary (Last 24 hours) at 09/01/2023 1156 Last data filed at 09/01/2023 0748 Gross per 24 hour  Intake 920 ml  Output 2275 ml  Net -1355 ml   [employer]  PROGRESS NOTE    Katherine Garrison  HYQ:[id number] DOB: 1941/01/03 DOA: 08/30/2023 PCP: Katherine Jacobson, MD   Brief Narrative: 82 year old with past medical history significant for hypertension, IBS, anxiety who presents with left lower extremity deformity pain after syncopal episode resulting in fall.  Patient has been heard a "thud", he found patient on the floor unconscious.  She regained consciousness 20 seconds after but does not remember how she fell.  Patient has had a prior history of Garrison related to acute pain, nausea.  Patient has been doing well prior to this event.  Evaluation in the ED CT head and CT cervical spine negative.  Chest x-Chestnutt negative.  X-Whetstone of the hip demonstrate acute comminuted left intertrochanteric femur fracture.  In the ED patient had an acute episode of hypotension in the setting of severe pain, nausea which resolved with IV fluids.     Assessment & Plan:   Principal Problem:   Closed left hip fracture, initial encounter [facility]) Active Problems:   Anxiety disorder   Essential hypertension   Garrison   Hypokalemia  Garrison, versus mild TBI: -Patient has a prior history of Garrison neurally mediated by description.  She was in her usual state with no prior triggers or prodromal -LOC may have resulted from slip/fall with head injury. -Continue to monitor on Telemetry - ECHO. Normal EF 65 %, no regional wall motion abnormalities, grade 1 diastolic dysfunction.  -Troponin negative. EKG: sinus rhythm.  Monitor on telemetry.  No significant events so far  Left hip fracture: -Orthopedic consulted, this post left IM fixation with Dr. Aundria Garrison today.   -PT/OT consult pending -Continue with as needed pain medications  -Continue with subcu Lovenox for DVT prophylaxis  Hypertension: -For blood pressure seems soft to acceptable off home medications including 1, hydrochlorothiazide and Norvasc . -Keep on as needed hydralazine, and resume home meds gradually if blood  pressure started to increase.  Plan to hold Diovan HCTZ preop and in the setting of hypotensive episode in the ED -Would hold Norvasc as well due to hypotensive episode in the ED>    Hypokalemia: -Replaced.   Mild Hyponatremia -On IV fluids.  -Monitor.   Hypomagnesemia:  -Replete IV>   Anemia:  -will check anemia panel.    Anxiety: -Continue Zoloft and BuSpar      Estimated body mass index is 21.63 kg/m as calculated from the following:   Height as of this encounter: 5\' 5"  (1.651 m).   Weight as of this encounter: 59 kg.   DVT prophylaxis: SCD, Lovenox Code Status: Full code Family Communication: Discussed with daughter at bedside Disposition Plan:  Status is: Inpatient Remains inpatient appropriate because: management of hip fracture.     Consultants:  Orthopedic Dr Katherine Garrison  Procedures:  ECHO  Antimicrobials:    Subjective: No significant events overnight, she denies any complaints  Objective: Vitals:   09/01/23 0024 09/01/23 0400 09/01/23 0751 09/01/23 1112  BP: 112/61 (!) 117/58 112/78   Pulse: 79 76 79   Resp: 13 13 14    Temp: (!) 97.5 F (36.4 C) 97.6 F (36.4 C) 98.1 F (36.7 C) 98.6 F (37 C)  TempSrc: Oral Oral  Oral  SpO2: 97% 98% 99%   Weight:      Height:        Intake/Output Summary (Last 24 hours) at 09/01/2023 1156 Last data filed at 09/01/2023 0748 Gross per 24 hour  Intake 920 ml  Output 2275 ml  Net -1355 ml   [employer]

## 2023-09-01 NOTE — Evaluation (Signed)
Physical Therapy Evaluation Patient Details Name: Katherine Garrison MRN: [id number] DOB: Mar 16, 1941 Today's Date: 09/01/2023  History of Present Illness  Katherine Garrison is a 82 y.o. female  who presents to the ED with left leg deformity after syncopal episode with fall resulting in L hip fracture; s/p ORIF, WBAT; with medical history significant for hypertension, IBS, and anxiety  Clinical Impression   Pt admitted with above diagnosis. Lives at home with husband, in a 2-level home with a few steps to enter; Prior to admission, pt was able to manage independently, but had been experiencing L hip pain for a few months, and recently began using a rollator for amb; Presents to PT with functional dependencies, pain in L hip with weight baring, decr functional mobility compared to her normal; Today, pt needed up to heavy mod assist to getu pt o EOB, stand to RW, and take pivot steps bed to recliner with RW; Noteworthy that she experienced some pre-syncopal symptoms after a few minutes in standing (did not have to opportunity to take a BP in standing); Patient will benefit from continued inpatient follow up therapy, <3 hours/day; Pt currently with functional limitations due to the deficits listed below (see PT Problem List). Pt will benefit from skilled PT to increase their independence and safety with mobility to allow discharge to the venue listed below.           If plan is discharge home, recommend the following: A lot of help with walking and/or transfers;A lot of help with bathing/dressing/bathroom;Help with stairs or ramp for entrance   Can travel by private vehicle   No    Equipment Recommendations Rolling walker (2 wheels);BSC/3in1  Recommendations for Other Services       Functional Status Assessment Patient has had a recent decline in their functional status and demonstrates the ability to make significant improvements in function in a reasonable and predictable amount of time.     Precautions  / Restrictions Precautions Precautions: Fall Precaution Comments: Consider gettign a standing BP next session; got hot feeling and anxious with standing Restrictions Weight Bearing Restrictions: No LLE Weight Bearing: Weight bearing as tolerated      Mobility  Bed Mobility Overal bed mobility: Needs Assistance Bed Mobility: Supine to Sit     Supine to sit: Mod assist     General bed mobility comments: Light mod assist and use of bed pad to scoot to EOB; mod assist to elevate trunk tosit    Transfers Overall transfer level: Needs assistance Equipment used: Rolling walker (2 wheels) Transfers: Sit to/from Stand, Bed to chair/wheelchair/BSC Sit to Stand: Mod assist   Step pivot transfers: Min assist       General transfer comment: Heavy mod assist of 1 to pwoer up to stand, limited weight acceptance LLE due to pain; Took 5-6 pivot steps bed to chair with RW; cues forsequence and to use UEs on RW to unweigh painful LLE in stance as needed while allowing for RLE stepping    Ambulation/Gait               General Gait Details: Hopefull next session  Stairs            Wheelchair Mobility     Tilt Bed    Modified Rankin (Stroke Patients Only)       Balance Overall balance assessment: Needs assistance   Sitting balance-Leahy Scale: Fair       Standing balance-Leahy Scale: Poor  Pertinent Vitals/Pain Pain Assessment Pain Assessment: Faces Faces Pain Scale: Hurts even more Pain Location: L hip with weight bearing Pain Descriptors / Indicators: Grimacing, Guarding Pain Intervention(s): Monitored during session, Premedicated before session, Repositioned    Home Living Family/patient expects to be discharged to:: Private residence Living Arrangements: Spouse/significant other Available Help at Discharge: Family;Available 24 hours/day;Other (Comment) (Spouse can give Superivion and setup assistance; can't give  physical assist) Type of Home: House Home Access: Stairs to enter Entrance Stairs-Rails: Left Entrance Stairs-Number of Steps: a few Alternate Level Stairs-Number of Steps:  (Does not go upstairs) Home Layout: Two level;Able to live on main level with bedroom/bathroom Home Equipment: Rollator (4 wheels)      Prior Function Prior Level of Function : Independent/Modified Independent             Mobility Comments: Occasional use of rollator ADLs Comments: independent     Extremity/Trunk Assessment   Upper Extremity Assessment Upper Extremity Assessment: Defer to OT evaluation    Lower Extremity Assessment Lower Extremity Assessment: LLE deficits/detail LLE Deficits / Details: Grossly decr AROM adn strength, limited by pain postop LLE: Unable to fully assess due to pain       Communication   Communication Communication: No apparent difficulties  Cognition Arousal: Alert Behavior During Therapy: WFL for tasks assessed/performed, Anxious Overall Cognitive Status: Within Functional Limits for tasks assessed                                 General Comments: Anxious with anticipation of pain        General Comments General comments (skin integrity, edema, etc.): O2 sats drifted down to below 88% during session, which was conducted on Room Air; restarted supplemental O2    Exercises     Assessment/Plan    PT Assessment Patient needs continued PT services  PT Problem List Decreased strength;Decreased range of motion;Decreased activity tolerance;Decreased balance;Decreased mobility;Decreased coordination;Decreased knowledge of use of DME;Decreased safety awareness;Decreased knowledge of precautions;Cardiopulmonary status limiting activity;Pain       PT Treatment Interventions DME instruction;Gait training;Stair training;Functional mobility training;Therapeutic activities;Therapeutic exercise;Balance training;Neuromuscular re-education;Cognitive  remediation;Patient/family education;Wheelchair mobility training    PT Goals (Current goals can be found in the Care Plan section)  Acute Rehab PT Goals Patient Stated Goal: be safe getting home PT Goal Formulation: With patient Time For Goal Achievement: 09/15/23 Potential to Achieve Goals: Good    Frequency Min 1X/week     Co-evaluation               AM-PAC PT "6 Clicks" Mobility  Outcome Measure Help needed turning from your back to your side while in a flat bed without using bedrails?: A Lot Help needed moving from lying on your back to sitting on the side of a flat bed without using bedrails?: A Lot Help needed moving to and from a bed to a chair (including a wheelchair)?: A Lot Help needed standing up from a chair using your arms (e.g., wheelchair or bedside chair)?: A Lot Help needed to walk in hospital room?: Total Help needed climbing 3-5 steps with a railing? : Total 6 Click Score: 10    End of Session Equipment Utilized During Treatment: Gait belt;Oxygen Activity Tolerance: Patient tolerated treatment well Patient left: in chair;with call bell/phone within reach;with chair alarm set Nurse Communication: Mobility status;Other (comment) (O2 sat drop on room air; considerations for back to bed) PT Visit Diagnosis: Unsteadiness on feet (  R26.81);Pain Pain - Right/Left: Left Pain - part of body: Hip    Time: 8295-6213 PT Time Calculation (min) (ACUTE ONLY): 38 min   Charges:   PT Evaluation $PT Eval Moderate Complexity: 1 Mod PT Treatments $Therapeutic Activity: 23-37 mins PT General Charges $$ ACUTE PT VISIT: 1 Visit         Van Clines, PT  Acute Rehabilitation Services Office [phone] Secure Chat welcomed   Levi Aland 09/01/2023, 4:59 PM

## 2023-09-02 ENCOUNTER — Inpatient Hospital Stay (HOSPITAL_COMMUNITY): Payer: Medicare Other

## 2023-09-02 DIAGNOSIS — S72002A Fracture of unspecified part of neck of left femur, initial encounter for closed fracture: Secondary | ICD-10-CM | POA: Diagnosis not present

## 2023-09-02 LAB — BASIC METABOLIC PANEL
Anion gap: 8 (ref 5–15)
BUN: 7 mg/dL — ABNORMAL LOW (ref 8–23)
CO2: 28 mmol/L (ref 22–32)
Calcium: 8.5 mg/dL — ABNORMAL LOW (ref 8.9–10.3)
Chloride: 103 mmol/L (ref 98–111)
Creatinine, Ser: 0.47 mg/dL (ref 0.44–1.00)
GFR, Estimated: >60 mL/min (ref >60)
Glucose, Bld: 103 mg/dL — ABNORMAL HIGH (ref 70–99)
Potassium: 4 mmol/L (ref 3.5–5.1)
Sodium: 139 mmol/L (ref 135–145)

## 2023-09-02 LAB — CBC
HCT: 25.1 % — ABNORMAL LOW (ref 36.0–46.0)
Hemoglobin: 7.9 g/dL — ABNORMAL LOW (ref 12.0–15.0)
MCH: 30.4 pg (ref 26.0–34.0)
MCHC: 31.5 g/dL (ref 30.0–36.0)
MCV: 96.5 fL (ref 80.0–100.0)
Platelets: 154 10*3/uL (ref 150–400)
RBC: 2.6 MIL/uL — ABNORMAL LOW (ref 3.87–5.11)
RDW: 12.9 % (ref 11.5–15.5)
WBC: 6.6 10*3/uL (ref 4.0–10.5)
nRBC: 0 % (ref 0.0–0.2)

## 2023-09-02 LAB — PREPARE RBC (CROSSMATCH)

## 2023-09-02 MED ORDER — SODIUM CHLORIDE 0.9% IV SOLUTION: Freq: Once | INTRAVENOUS | Status: AC

## 2023-09-02 MED ORDER — HYDROCHLOROTHIAZIDE 25 MG PO TABS
25.0000 mg | ORAL_TABLET | Freq: Every day | ORAL | Status: DC
  Administered 2023-09-02 – 2023-09-04 (×3): 25 mg via ORAL
  Filled 2023-09-02 (×3): qty 1

## 2023-09-02 MED ORDER — ASPIRIN 81 MG PO TBEC: 81.0000 mg | DELAYED_RELEASE_TABLET | Freq: Two times a day (BID) | ORAL | 0 refills | Status: AC

## 2023-09-02 MED ORDER — IRBESARTAN 300 MG PO TABS
300.0000 mg | ORAL_TABLET | Freq: Every day | ORAL | Status: DC
  Administered 2023-09-02 – 2023-09-04 (×3): 300 mg via ORAL
  Filled 2023-09-02 (×3): qty 1

## 2023-09-02 MED ORDER — HYDROCODONE-ACETAMINOPHEN 5-325 MG PO TABS: 1.0000 | ORAL_TABLET | ORAL | 0 refills | Status: DC | PRN

## 2023-09-02 MED ORDER — VALSARTAN-HYDROCHLOROTHIAZIDE 320-25 MG PO TABS: 1.0000 | ORAL_TABLET | Freq: Every day | ORAL | Status: DC

## 2023-09-02 NOTE — Progress Notes (Signed)
Occupational Therapy Treatment Patient Details Name: Katherine Garrison MRN: [id number] DOB: Apr 15, 1941 Today's Date: 09/02/2023   History of present illness Katherine Garrison is a 82 y.o. female  who presents to the ED with left leg deformity after syncopal episode with fall resulting in L hip fracture; s/p ORIF, WBAT.  PMH: hypertension, IBS, and anxiety   OT comments  Pt reported they were in more discomfort due to back pain versus LLE pain when attempting to complete mobility in this session. [name] was able to complete bed mobility with moderate assist and sit to stand transfer with moderate assist. She agreed to try to go to chair but was only able to complete step pivot to R side with min assist at this time. Acute Occupational Therapy will continue to work with patient to maximize independence in ADLS.        If plan is discharge home, recommend the following:  A lot of help with walking and/or transfers;A lot of help with bathing/dressing/bathroom;Assistance with cooking/housework;Direct supervision/assist for medications management;Direct supervision/assist for financial management;Assist for transportation   Equipment Recommendations   (TBD at next level of care)    Recommendations for Other Services      Precautions / Restrictions Precautions Precautions: Fall Precaution Comments: watch o2, getting 1 unit of blood today Restrictions Weight Bearing Restrictions: Yes LLE Weight Bearing: Weight bearing as tolerated       Mobility Bed Mobility Overal bed mobility: Needs Assistance Bed Mobility: Supine to Sit     Supine to sit: Mod assist, HOB elevated     General bed mobility comments: Pt needed cues on sequencing on how to place hands    Transfers Overall transfer level: Needs assistance Equipment used: Rolling walker (2 wheels) Transfers: Sit to/from Stand Sit to Stand: Mod assist Stand pivot transfers: Min assist, Contact guard assist   Step pivot transfers: Min  assist     General transfer comment: took two attempts to complete sit to stand transfer     Balance Overall balance assessment: Needs assistance   Sitting balance-Leahy Scale: Fair   Postural control: Posterior lean, Right lateral lean, Left lateral lean Standing balance support: Bilateral upper extremity supported Standing balance-Leahy Scale: Poor                             ADL either performed or assessed with clinical judgement   ADL Overall ADL's : Needs assistance/impaired Eating/Feeding: Independent;Sitting   Grooming: Brushing hair;Set up;Sitting   Upper Body Bathing: Contact guard assist;Sitting   Lower Body Bathing: Maximal assistance;Sit to/from stand;Total assistance   Upper Body Dressing : Contact guard assist;Sitting   Lower Body Dressing: Maximal assistance;Sit to/from stand;Total assistance   Toilet Transfer: Moderate assistance;Cueing for safety;Cueing for sequencing;Stand-pivot;Rolling walker (2 wheels)   Toileting- Clothing Manipulation and Hygiene: Total assistance;Sit to/from stand              Extremity/Trunk Assessment Upper Extremity Assessment Upper Extremity Assessment: Overall WFL for tasks assessed   Lower Extremity Assessment Lower Extremity Assessment: Defer to PT evaluation        Vision       Perception     Praxis      Cognition Arousal: Alert Behavior During Therapy: WFL for tasks assessed/performed, Anxious Overall Cognitive Status: Difficult to assess  General Comments: noted in session needed to repeat often to patient, per pt reported they noted some difficulties in [facility]        Exercises      Shoulder Instructions       General Comments Pt when sitting at rest noted that destated beloow 90% on RA and was unable to bring back up and required 2L via [place]    Pertinent Vitals/ Pain       Pain Assessment Pain Assessment: Faces Faces Pain Scale: Hurts  even more Pain Location: Pt mostly reported pain in back but some in LLE during session Pain Descriptors / Indicators: Grimacing, Guarding Pain Intervention(s): Limited activity within patient's tolerance, Monitored during session  Home Living Family/patient expects to be discharged to:: Private residence Living Arrangements: Spouse/significant other Available Help at Discharge: Family;Available 24 hours/day;Other (Comment) Type of Home: House Home Access: Stairs to enter [employer] of Steps: a few Entrance Stairs-Rails: Left Home Layout: Two level;Able to live on main level with bedroom/bathroom     Bathroom Shower/Tub: Walk-in shower         Home Equipment: Rollator (4 wheels)          Prior Functioning/Environment              Frequency  Min 2X/week        Progress Toward Goals  OT Goals(current goals can now be found in the care plan section)     Acute Rehab OT Goals Patient Stated Goal: to keep moving OT Goal Formulation: With patient Time For Goal Achievement: 09/16/23 Potential to Achieve Goals: Good  Plan      Co-evaluation                 AM-PAC OT "6 Clicks" Daily Activity     Outcome Measure   Help from another person eating meals?: None Help from another person taking care of personal grooming?: A Little Help from another person toileting, which includes using toliet, bedpan, or urinal?: A Lot Help from another person bathing (including washing, rinsing, drying)?: A Lot Help from another person to put on and taking off regular upper body clothing?: A Little Help from another person to put on and taking off regular lower body clothing?: A Lot 6 Click Score: 16    End of Session Equipment Utilized During Treatment: Gait belt;Rolling walker (2 wheels)  OT Visit Diagnosis: Unsteadiness on feet (R26.81);Other abnormalities of gait and mobility (R26.89);Muscle weakness (generalized) (M62.81);History of falling  (Z91.81);Pain Pain - Right/Left: Left Pain - part of body: Hip   Activity Tolerance Patient limited by fatigue;Patient limited by pain   Patient Left in chair;with call bell/phone within reach   Nurse Communication Mobility status        Time: 0737-0820 OT Time Calculation (min): 43 min  Charges: OT General Charges $OT Visit: 1 Visit OT Evaluation $OT Eval Low Complexity: 1 Low OT Treatments $Self Care/Home Management : 23-37 mins  Presley Raddle OTR/L  Acute Rehab Services  [phone] office number   Alphia Moh 09/02/2023, 9:53 AM

## 2023-09-02 NOTE — TOC Initial Note (Signed)
Transition of Care [facility]) - Initial/Assessment Note    Patient Details  Name: Katherine Garrison MRN: [id number] Date of Birth: 1941-09-21  Transition of Care [facility]) CM/SW Contact:    Mearl Latin, LCSW Phone Number: 09/02/2023, 12:29 PM  Clinical Narrative:                 CSW received consult for possible SNF placement at time of discharge. CSW spoke with patient and spouse at bedside. Patient reported that patient's spouse is currently unable to care for patient at their home given patient's current physical needs and fall risk. Patient expressed understanding of PT recommendation and is agreeable to SNF placement at time of discharge. Patient reports preference for [facility]. CSW discussed insurance authorization process and will provide Medicare SNF ratings list. CSW will send out referrals for review and provide bed offers as available.   Patient requested CSW speak with her daughter in law, Marylu Lund, about facility options as well. CSW spoke with Marylu Lund, who works for [employer]. [facility] reviewing referral but may not have beds until Thursday.   Skilled Nursing Rehab Facilities-   [email or web address]   Ratings out of 5 stars (5 the highest)   Name Address  Phone # Quality Care Staffing Health Inspection Overall  [facility] [address] [place] [phone] 2 1 5 4   [facility] [address] [place] [phone] 4 1 3 2   [facility] [address], [facility] [phone] 2 1 1 1   [facility] [address], [place] [phone] 4 1 3 2   [facility]  [address], [place] [phone] 3 2 5 5   [facility] [address], [facility] [phone] 2 1 2 1   [facility] [address], [place] [phone] 4 1 2 1   [facility] [address] [place] [phone] 2 4 3 3   [address] ([place]) [address], [place] [phone] 3 2 2 2   [facility] [address] [place], [place] [phone] 1 2 1 1   [facility] [address], [place] [phone] 3 1 1 1   Deztiny Dejong [address] Liliane Shi [phone] 4 3 4 4   [facility] [address], [place] [phone] 3 4 3 3           [facility] [address], [place] [phone]      [facility], [place] [address], [place] [phone] 1 1 2 1   [facility] [address] [employer] [phone] 2 2 4 4   [facility] [address] [place] Ditto [phone] 2 1 4 3   [facility] [address], [place] [phone] 3 3 3 3           [address] (no [facility]) [address], [place] [phone] 4 4 5 5   [facility] (No [employer]) [address] [place], [place] [phone] 2 2 4 4   [facility] [address], [place] [phone] 2 1 2 1   [facility] (No [employer]) [address], [place] [phone] 5 5 5 5   [facility] [address], [place] [phone] 2 3 5 5   [facility] [address], [place] [phone] 2 1 1 1   [facility] [address] Liliane Shi [phone] 5 2 5 5   [facility]  [address] [place] [phone] 2 2 2 2   [facility] [address] [place] [phone] 4 2 1 1   [facility] [address], [employer]  [phone] 2 2 3 3           [facility] [address] [place] [phone] 1 1 1 1   [facility] [address] Evlyn Clines  [phone] 2 3 3 3   [facility] (No Humana) [address] [place] [phone] 2 1 3 2   [facility]) [address], [place]al[place] Levan [phone] 1 1 1 1   [facility] [address] [place]an [phone] 3 2 5 5   [facility] [address], [place] [phone] 2 1 1 1           [facility] [address] [place], [place] [phone] 4 4 5 5   [facility])  [address] [place]  [phone] 2 1 2 1   [facility]) [address], [place] [phone]  1 4 3   [facility] [address] [place] [phone] 3 5 4 5   [address] [place], [place] [phone] 3 2 2 2   [facility]) [address] [place] [phone] 2 1 3 2      Expected Discharge Plan: Skilled Nursing Facility Barriers to Discharge: Continued Medical Work up, SNF Pending bed offer   Patient Goals and CMS Choice Patient states their goals for this hospitalization and ongoing recovery are:: Rehab CMS Medicare.gov Compare Post Acute Care list provided to:: Patient Choice offered to / list presented to : Patient [place] ownership interest in [facility].provided to:: Patient    Expected Discharge Plan and Services In-house Referral: Clinical Social Work   Post Acute Care Choice: Skilled Nursing Facility Living arrangements for the past 2 months: Single Family Home                                      Prior Living Arrangements/Services Living arrangements for the past 2 months: Single Family Home Lives with:: Spouse Patient language and need for interpreter reviewed:: Yes Do you feel safe going back to the place where you live?: Yes      Need for Family Participation in Patient Care: Yes (Comment) Care giver support system in place?: Yes (comment)   Criminal Activity/Legal Involvement Pertinent to Current Situation/Hospitalization: No - Comment as needed  Activities of Daily Living Home Assistive Devices/Equipment: Dan Humphreys (specify type) ADL Screening (condition at time of admission) Patient's cognitive ability adequate to safely complete daily activities?: Yes Is the patient deaf or have difficulty hearing?: No Does the patient have difficulty seeing, even when wearing glasses/contacts?: No Does the patient have difficulty concentrating, remembering, or making decisions?: No Patient able to  express need for assistance with ADLs?: No Does the patient have difficulty dressing or bathing?: No Independently performs ADLs?: No Communication: Independent Dressing (OT): Needs assistance Grooming: Independent Feeding: Independent Bathing: Needs assistance Toileting: Needs assistance Does the patient have difficulty walking or climbing stairs?: Yes Weakness of Legs: Both Weakness of Arms/Hands: None  Permission Sought/Granted Permission sought to share information with : Facility Medical sales representative, Family Supports Permission granted to share information with : Yes, Verbal Permission Granted     Permission granted to share info w AGENCY: SNFs  Permission granted to share info w Relationship: Spouse     Emotional Assessment Appearance:: Appears stated age Attitude/Demeanor/Rapport: Engaged Affect (typically observed): Accepting, Appropriate Orientation: : Oriented to Self, Oriented to Place, Oriented to  Time, Oriented to Situation Alcohol / Substance Use: Alcohol  Use Psych Involvement: No (comment)  Admission diagnosis:  Closed fracture of left hip, initial encounter (HCC) [S72.002A] Closed left hip fracture, initial encounter (HCC) [S72.002A] Closed comminuted fracture of left hip, initial encounter (HCC) [S72.092A] Syncope, unspecified syncope type [R55] Patient Active Problem List   Diagnosis Date Noted   Closed left hip fracture, initial encounter (HCC) 08/31/2023   Syncope 08/31/2023   Hypokalemia 08/31/2023   Myofascial pain 06/20/2023   Ischial bursitis 05/25/2023   Sacroiliac joint pain 04/20/2023   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   Yeast vaginitis 02/22/2021   Rectal prolapse 09/29/2020   Dermatochalasis of upper and lower eyelids of both eyes 08/23/2020   S/P laparoscopic hernia repair 01/07/2019   Diverticulosis large intestine w/o perforation or abscess w/o bleeding 12/26/2018   Diverticulitis of sigmoid colon 12/24/2018   Aortic  atherosclerosis (HCC) 11/18/2018   History of epistaxis 05/24/2018   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   Osteopenia 09/24/2016   Anxiety disorder 11/21/2015   Allergic rhinitis 11/21/2015   Atrophic vaginitis 11/21/2015   Colon polyps 12/21/2013   Irritable bowel syndrome 01/08/2013   Other specified diseases of intestine 01/08/2013   Abdominal bloating 10/06/2012   Chronic idiopathic constipation 10/06/2012   Essential hypertension 08/21/2012   Hemorrhoid 07/25/2012   PCP:  Loyal Jacobson, MD Pharmacy:   [facility] [phone] - [place] - [address] AT [facility] [address] [place] [address] Phone: [phone] Fax: [phone]     Social Determinants of Health (SDOH) Social History: SDOH Screenings   Food Insecurity: Patient Declined (08/31/2023)  Housing: Low Risk  (08/31/2023)  Transportation Needs: Patient Declined (08/31/2023)  Utilities: Patient Declined (08/31/2023)  Tobacco Use: Medium Risk (08/31/2023)   SDOH Interventions:     Readmission Risk Interventions     No data to display

## 2023-09-02 NOTE — Progress Notes (Signed)
   Subjective:  Katherine Garrison is a 82 y.o. female, 2 Days Post-Op   s/p Procedure(s): INTRAMEDULLARY (IM) NAIL INTERTROCHANTERIC   Patient reports pain as mild to moderate.  Getting some blood today, feels ok just not much of an appetite. Denies n/t, fever or chills. Passing gas.   Objective:   VITALS:   Vitals:   09/01/23 2004 09/02/23 0000 09/02/23 0005 09/02/23 0400  BP: (!) 145/69 136/74  136/70  Pulse: 89 93  98  Resp: 20  18 19   Temp: 99 F (37.2 C)   98.5 F (36.9 C)  TempSrc: Oral   Oral  SpO2: 98%  96% 98%  Weight:      Height:       In bed NAD  LLE:   Neurovascular intact Sensation intact distally Intact pulses distally Dorsiflexion/Plantar flexion intact Incision: dressing C/D/I No cellulitis present Compartment soft Aquacell x2 , calves soft non tender , minimal pain with log roll  Lab Results  Component Value Date   WBC 6.6 09/02/2023   HGB 7.9 (L) 09/02/2023   HCT 25.1 (L) 09/02/2023   MCV 96.5 09/02/2023   PLT 154 09/02/2023   BMET    Component Value Date/Time   NA 139 09/02/2023 0524   K 4.0 09/02/2023 0524   CL 103 09/02/2023 0524   CO2 28 09/02/2023 0524   GLUCOSE 103 (H) 09/02/2023 0524   BUN 7 (L) 09/02/2023 0524   CREATININE 0.47 09/02/2023 0524   CALCIUM 8.5 (L) 09/02/2023 0524   GFRNONAA >60 09/02/2023 0524     Assessment/Plan: 2 Days Post-Op   Principal Problem:   Closed left hip fracture, initial encounter (HCC) Active Problems:   Anxiety disorder   Essential hypertension   Syncope   Hypokalemia   Advance diet Up with therapy  Dispo : Based on current progress recommending SNF  Hgb 7.9 - getting transfusion per primary.  Ok to shower with aquacell after day 3 , can change as needed if saturated.  otherwise leave on until follow up  Weightbearing Status: WBAT LLE with walker DVT Prophylaxis: Lovenox inpatient, 81mg  of aspirin BID x 6 weeks outpatient Pain med and dvt prophylaxis printed  Will need to make appt  2 weeks or so from surgery at [facility]  Ortho will sign off.   Arbie Cookey 09/02/2023, 7:34 AM  Dion Saucier PA-C  Physician Assistant with Dr. Rebekah [place] [facility]

## 2023-09-02 NOTE — Progress Notes (Addendum)
Physical Therapy Treatment Patient Details Name: Katherine Garrison MRN: [id number] DOB: [birth year] Today's Date: 09/02/2023   History of Present Illness ANAUTICA MEAGER is a 82 y.o. female  who presents to the ED with left leg deformity after syncopal episode with fall resulting in L hip fracture; s/p ORIF, WBAT.  PMH: hypertension, IBS, and anxiety    PT Comments  Pt up in recliner following OT session. Pt declining further mobility but agreeable to LE exercises. Exercises performed in recliner with feet elevated. Pt is scheduled to receive one unit PRBC today due to 7.9 Hgb. Pt on 2L throughout session. Pt remained in recliner at end of session.     If plan is discharge home, recommend the following: A lot of help with walking and/or transfers;A lot of help with bathing/dressing/bathroom;Help with stairs or ramp for entrance   Can travel by private vehicle     No  Equipment Recommendations  Rolling walker (2 wheels);BSC/3in1    Recommendations for Other Services       Precautions / Restrictions Precautions Precautions: Fall;Other (comment) Precaution Comments: watch O2 Restrictions Weight Bearing Restrictions: Yes LLE Weight Bearing: Weight bearing as tolerated     Mobility  Bed Mobility                    Transfers                        Ambulation/Gait                   Stairs             Wheelchair Mobility     Tilt Bed    Modified Rankin (Stroke Patients Only)       Balance                                            Cognition Arousal: Alert Behavior During Therapy: WFL for tasks assessed/performed, Anxious Overall Cognitive Status: No family/caregiver present to determine baseline cognitive functioning Area of Impairment: Attention, Memory, Following commands, Awareness, Problem solving                   Current Attention Level: Selective Memory: Decreased short-term memory Following Commands:  Follows one step commands consistently   Awareness: Emergent Problem Solving: Difficulty sequencing, Requires verbal cues, Slow processing          Exercises General Exercises - Lower Extremity Ankle Circles/Pumps: AROM, Both, 10 reps Quad Sets: AROM, Both, 10 reps Gluteal Sets: AROM, Both, 10 reps Heel Slides: AROM, AAROM, Right, Left, 10 reps Hip ABduction/ADduction: AROM, AAROM, Right, Left, 10 reps    General Comments General comments (skin integrity, edema, etc.): Pt when sitting at rest noted that destated beloow 90% on RA and was unable to bring back up and required 2L via [place]      Pertinent Vitals/Pain Pain Assessment Pain Assessment: Faces Faces Pain Scale: Hurts whole lot Pain Location: back and LLE Pain Descriptors / Indicators: Discomfort, Grimacing, Guarding Pain Intervention(s): Limited activity within patient's tolerance, Monitored during session, Repositioned    Home Living Family/patient expects to be discharged to:: Private residence Living Arrangements: Spouse/significant other Available Help at Discharge: Family;Available 24 hours/day;Other (Comment) Type of Home: House Home Access: Stairs to enter Entrance Stairs-Rails: Left Entrance Stairs-Number of Steps: a few   Home  Layout: Two level;Able to live on main level with bedroom/bathroom Home Equipment: Rollator (4 wheels)      Prior Function            PT Goals (current goals can now be found in the care plan section) Acute Rehab PT Goals Patient Stated Goal: not stated Progress towards PT goals: Progressing toward goals    Frequency    Min 1X/week      PT Plan      Co-evaluation              AM-PAC PT "6 Clicks" Mobility   Outcome Measure  Help needed turning from your back to your side while in a flat bed without using bedrails?: A Lot Help needed moving from lying on your back to sitting on the side of a flat bed without using bedrails?: A Lot Help needed moving to and  from a bed to a chair (including a wheelchair)?: A Lot Help needed standing up from a chair using your arms (e.g., wheelchair or bedside chair)?: A Lot Help needed to walk in hospital room?: Total Help needed climbing 3-5 steps with a railing? : Total 6 Click Score: 10    End of Session Equipment Utilized During Treatment: Gait belt;Oxygen Activity Tolerance: Patient limited by pain Patient left: in chair;with call bell/phone within reach;with chair alarm set Nurse Communication: Mobility status PT Visit Diagnosis: Unsteadiness on feet (R26.81);Pain Pain - Right/Left: Left Pain - part of body: Hip     Time: 0820-0836 PT Time Calculation (min) (ACUTE ONLY): 16 min  Charges:    $Therapeutic Exercise: 8-22 mins PT General Charges $$ ACUTE PT VISIT: 1 Visit                     Ferd Glassing., PT  Office # [phone]    Ilda Foil 09/02/2023, 11:32 AM

## 2023-09-02 NOTE — Progress Notes (Signed)
PROGRESS NOTE    Katherine Garrison  ZOX:[id number] DOB: [birth year] DOA: 08/30/2023 PCP: Loyal Jacobson, MD   Brief Narrative: 82 year old with past medical history significant for hypertension, IBS, anxiety who presents with left lower extremity deformity pain after syncopal episode resulting in fall.  Patient has been heard a "thud", he found patient on the floor unconscious.  She regained consciousness 20 seconds after but does not remember how she fell.  Patient has had a prior history of syncope related to acute pain, nausea.  Patient has been doing well prior to this event.  Evaluation in the ED CT head and CT cervical spine negative.  Chest x-Iturralde negative.  X-Mehan of the hip demonstrate acute comminuted left intertrochanteric femur fracture.  In the ED patient had an acute episode of hypotension in the setting of severe pain, nausea which resolved with IV fluids.     Assessment & Plan:   Principal Problem:   Closed left hip fracture, initial encounter [facility]) Active Problems:   Anxiety disorder   Essential hypertension   Syncope   Hypokalemia  Syncope, versus mild TBI: -Patient has a prior history of syncope neurally mediated by description.  She was in her usual state with no prior triggers or prodromal -LOC may have resulted from slip/fall with head injury. -Continue to monitor on Telemetry - ECHO. Normal EF 65 %, no regional wall motion abnormalities, grade 1 diastolic dysfunction.  -Troponin negative. EKG: sinus rhythm.  Monitor on telemetry.  No significant events so far  Left hip fracture: -Orthopedic consulted, this post left IM fixation with Dr. Aundria Rud today.   -PT/OT consult pending -Continue with as needed pain medications  -Continue with subcu Lovenox for DVT prophylaxis  Acute blood loss anemia -postoperatively, will receive 1 unit PRBC transfusion today  Hypoxia -Desaturating 85 to 86%, but no respiratory distress, no tachypnea, she was encouraged to use  incentive spirometer, continue to provide oxygen, will obtain chest x-Thompson  Hypertension: -Blood pressure initially soft, now started to increase, will resume Diovan-hydrochlorothiazide, resume Norvasc tomorrow if remains on the lower side,  Hypokalemia: -Replaced.   Mild Hyponatremia -On IV fluids.  -Monitor.   Hypomagnesemia:  -Replete IV>   Anxiety: -Continue Zoloft and BuSpar   Estimated body mass index is 21.63 kg/m as calculated from the following:   Height as of this encounter: 5\' 5"  (1.651 m).   Weight as of this encounter: 59 kg.   DVT prophylaxis: SCD, Lovenox Code Status: Full code Family Communication: None at bedside Disposition Plan:  Status is: Inpatient Remains inpatient appropriate because: management of hip fracture.     Consultants:  Orthopedic Dr Aundria Rud  Procedures:  ECHO  Antimicrobials:    Subjective: She denies any complaints today, Foley catheter discontinued yesterday, no evidence of retention.  Objective: Vitals:   09/02/23 0815 09/02/23 0849 09/02/23 0916 09/02/23 1200  BP: (!) 152/65 (!) 158/75 (!) 153/53 (!) 154/90  Pulse: 96 98 (!) 107 80  Resp: 20 14 14 17   Temp:  98.4 F (36.9 C) 98.3 F (36.8 C) 98.3 F (36.8 C)  TempSrc:  Oral Oral Oral  SpO2:  96% 97% 100%  Weight:      Height:        Intake/Output Summary (Last 24 hours) at 09/02/2023 1229 Last data filed at 09/02/2023 1130 Gross per 24 hour  Intake 408.67 ml  Output --  Net 408.67 ml   Filed Weights   08/30/23 2147 08/30/23 2242 08/31/23 1305  Weight: 58.2 kg 59  kg 59 kg    Examination:  Awake Alert, Oriented X 3, she has left forehead and shoulder bruise from fall at home. Symmetrical Chest wall movement, Good air movement bilaterally, CTAB RRR,No Gallops,Rubs or new Murmurs, No Parasternal Heave +ve B.Sounds, Abd Soft, No tenderness, No rebound - guarding or rigidity. No Cyanosis, Clubbing or edema, No new Rash or bruise     CBC: Recent Labs  Lab  08/30/23 2150 08/31/23 0433 08/31/23 1636 09/01/23 0832 09/02/23 0524  WBC 5.8 9.9 10.0 6.9 6.6  NEUTROABS 3.8  --   --   --   --   HGB 11.6* 10.7* 10.5* 9.1* 7.9*  HCT 34.8* 32.8* 31.9* 27.1* 25.1*  MCV 91.8 92.1 93.3 95.4 96.5  PLT 197 188 191 155 154   Basic Metabolic Panel: Recent Labs  Lab 08/30/23 2326 08/31/23 0433 08/31/23 1636 09/01/23 0832 09/02/23 0524  NA 138 133*  --  137 139  K 3.3* 3.7  --  4.1 4.0  CL 97* 103  --  103 103  CO2 21* 22  --  28 28  GLUCOSE 132* 139*  --  120* 103*  BUN 12 10  --  6* 7*  CREATININE 0.62 0.54 0.74 0.60 0.47  CALCIUM 8.5* 8.2*  --  8.3* 8.5*  MG  --  1.6*  --   --   --    GFR: Estimated Creatinine Clearance: 48.8 mL/min (by C-G formula based on SCr of 0.47 mg/dL). Liver Function Tests: Recent Labs  Lab 08/30/23 2326  AST 27  ALT 19  ALKPHOS 39  BILITOT 0.6  PROT 5.7*  ALBUMIN 3.2*   No results for input(s): "LIPASE", "AMYLASE" in the last 168 hours. No results for input(s): "AMMONIA" in the last 168 hours. Coagulation Profile: No results for input(s): "INR", "PROTIME" in the last 168 hours. Cardiac Enzymes: No results for input(s): "CKTOTAL", "CKMB", "CKMBINDEX", "TROPONINI" in the last 168 hours. BNP (last 3 results) No results for input(s): "PROBNP" in the last 8760 hours. HbA1C: No results for input(s): "HGBA1C" in the last 72 hours. CBG: No results for input(s): "GLUCAP" in the last 168 hours. Lipid Profile: No results for input(s): "CHOL", "HDL", "LDLCALC", "TRIG", "CHOLHDL", "LDLDIRECT" in the last 72 hours. Thyroid Function Tests: No results for input(s): "TSH", "T4TOTAL", "FREET4", "T3FREE", "THYROIDAB" in the last 72 hours. Anemia Panel: Recent Labs    08/31/23 0929 08/31/23 1636  VITAMINB12 660  --   FOLATE 31.4  --   FERRITIN 315*  --   TIBC 274  --   IRON 29  --   RETICCTPCT  --  1.6   Sepsis Labs: No results for input(s): "PROCALCITON", "LATICACIDVEN" in the last 168 hours.  Recent  Results (from the past 240 hour(s))  Surgical pcr screen     Status: None   Collection Time: 08/31/23 12:58 PM   Specimen: Nasal Mucosa; Nasal Swab  Result Value Ref Range Status   MRSA, PCR NEGATIVE NEGATIVE Final   Staphylococcus aureus NEGATIVE NEGATIVE Final    Comment: (NOTE) The Xpert SA Assay (FDA approved for NASAL specimens in patients 93 years of age and older), is one component of a comprehensive surveillance program. It is not intended to diagnose infection nor to guide or monitor treatment. Performed at [facility], [address], [place], [place] [address]          Radiology Studies: DG HIP UNILAT WITH PELVIS 2-3 VIEWS LEFT  Result Date: 08/31/2023 CLINICAL DATA:  [id number]  Surgery, elective [id number] EXAM: DG HIP (WITH OR WITHOUT PELVIS) 2-3V LEFT COMPARISON:  X-Casas 08/30/2023 FINDINGS: Four C-arm fluoroscopic images were obtained intraoperatively and submitted for post operative interpretation. Images demonstrate placement of left hip ORIF hardware traversing intertrochanteric fracture with improved alignment. 45 seconds of fluoroscopy time utilized. Radiation dose: 5.56 mGy. Please see the performing provider's procedural report for further detail. IMPRESSION: Intraoperative fluoroscopic guidance for left hip ORIF. Electronically Signed   By: Duanne Guess D.O.   On: 08/31/2023 18:29   DG C-Arm 1-60 Min-No Report  Result Date: 08/31/2023 Fluoroscopy was utilized by the requesting physician.  No radiographic interpretation.        Scheduled Meds:  busPIRone  15 mg Oral TID   calcium carbonate  1 tablet Oral BID WC   enoxaparin (LOVENOX) injection  40 mg Subcutaneous Q24H   feeding supplement  237 mL Oral BID BM   fluticasone  1 spray Each Nare Daily   multivitamin with minerals  1 tablet Oral Daily   senna-docusate  1 tablet Oral BID   sertraline  50 mg Oral Daily   Continuous Infusions:  methocarbamol (ROBAXIN) IV       LOS: 2 days        Huey Bienenstock, MD [facility]   If 7PM-7AM, please contact night-coverage [email or web address]  09/02/2023, 12:29 PM

## 2023-09-02 NOTE — NC FL2 (Signed)
MEDICAID FL2 LEVEL OF CARE FORM     IDENTIFICATION  Patient Name: Katherine Garrison Birthdate: 10/16/1941 Sex: female Admission Date (Current Location): 08/30/2023  [facility] and [place] Number:  Producer, television/film/video and Address:  [facility], [address] [place], [place] [address]      Provider Number: [id number]  Attending Physician Name and Address:  Elgergawy, Leana Roe, MD  Relative Name and Phone Number:       Current Level of Care: Hospital Recommended Level of Care: Skilled Nursing Facility Prior Approval Number:    Date Approved/Denied:   PASRR Number: [id number]  Discharge Plan: SNF    Current Diagnoses: Patient Active Problem List   Diagnosis Date Noted   Closed left hip fracture, initial encounter (HCC) 08/31/2023   Syncope 08/31/2023   Hypokalemia 08/31/2023   Myofascial pain 06/20/2023   Ischial bursitis 05/25/2023   Sacroiliac joint pain 04/20/2023   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   Yeast vaginitis 02/22/2021   Rectal prolapse 09/29/2020   Dermatochalasis of upper and lower eyelids of both eyes 08/23/2020   S/P laparoscopic hernia repair 01/07/2019   Diverticulosis large intestine w/o perforation or abscess w/o bleeding 12/26/2018   Diverticulitis of sigmoid colon 12/24/2018   Aortic atherosclerosis (HCC) 11/18/2018   History of epistaxis 05/24/2018   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   Osteopenia 09/24/2016   Anxiety disorder 11/21/2015   Allergic rhinitis 11/21/2015   Atrophic vaginitis 11/21/2015   Colon polyps 12/21/2013   Irritable bowel syndrome 01/08/2013   Other specified diseases of intestine 01/08/2013   Abdominal bloating 10/06/2012   Chronic idiopathic constipation 10/06/2012   Essential hypertension 08/21/2012   Hemorrhoid 07/25/2012    Orientation RESPIRATION BLADDER Height & Weight     Self, Time, Situation, Place  O2 (2L nasal cannula) Continent Weight: 130 lb (59 kg) Height:   5\' 5"  (165.1 cm)  BEHAVIORAL SYMPTOMS/MOOD NEUROLOGICAL BOWEL NUTRITION STATUS      Continent Diet (See dc summary)  AMBULATORY STATUS COMMUNICATION OF NEEDS Skin   Extensive Assist Verbally Surgical wounds (Closed incision on hip)                       Personal Care Assistance Level of Assistance  Bathing, Feeding, Dressing Bathing Assistance: Maximum assistance Feeding assistance: Independent Dressing Assistance: Limited assistance     Functional Limitations Info             SPECIAL CARE FACTORS FREQUENCY  PT (By licensed PT), OT (By licensed OT)     PT Frequency: 5x/week OT Frequency: 5x/week            Contractures Contractures Info: Not present    Additional Factors Info  Code Status, Allergies Code Status Info: Full Allergies Info: Latex, Neosporin (Neomycin-bacitracin Zn-polymyx), Sulfa Antibiotics           Current Medications (09/02/2023):  This is the current hospital active medication list Current Facility-Administered Medications  Medication Dose Route Frequency Provider Last Rate Last Admin   acetaminophen (TYLENOL) tablet 325-650 mg  325-650 mg Oral Q6H PRN Yolonda Kida, MD   650 mg at 09/01/23 2356   busPIRone (BUSPAR) tablet 15 mg  15 mg Oral TID Yolonda Kida, MD   15 mg at 09/02/23 0843   calcium carbonate (OS-CAL - dosed in mg of elemental calcium) tablet 1,250 mg  1 tablet Oral BID WC Yolonda Kida, MD   1,250 mg at 09/02/23 [phone]  enoxaparin (LOVENOX) injection 40 mg  40 mg Subcutaneous Q24H Yolonda Kida, MD   40 mg at 09/02/23 0844   feeding supplement (ENSURE ENLIVE / ENSURE PLUS) liquid 237 mL  237 mL Oral BID BM Yolonda Kida, MD   237 mL at 09/02/23 0843   fluticasone (FLONASE) 50 MCG/ACT nasal spray 1 spray  1 spray Each Nare Daily Yolonda Kida, MD   1 spray at 09/02/23 0843   hydrALAZINE (APRESOLINE) injection 5 mg  5 mg Intravenous Q6H PRN Elgergawy, Leana Roe, MD        HYDROcodone-acetaminophen (NORCO) 7.5-325 MG per tablet 1-2 tablet  1-2 tablet Oral Q4H PRN Yolonda Kida, MD       HYDROcodone-acetaminophen (NORCO/VICODIN) 5-325 MG per tablet 1-2 tablet  1-2 tablet Oral Q4H PRN Yolonda Kida, MD   2 tablet at 09/02/23 2130   menthol-cetylpyridinium (CEPACOL) lozenge 3 mg  1 lozenge Oral PRN Yolonda Kida, MD       Or   phenol [facility]) mouth spray 1 spray  1 spray Mouth/Throat PRN Yolonda Kida, MD       methocarbamol (ROBAXIN) tablet 500 mg  500 mg Oral Q6H PRN Yolonda Kida, MD   500 mg at 09/02/23 8657   Or   methocarbamol (ROBAXIN) 500 mg in dextrose 5 % 50 mL IVPB  500 mg Intravenous Q6H PRN Yolonda Kida, MD       metoCLOPramide (REGLAN) tablet 5-10 mg  5-10 mg Oral Q8H PRN Yolonda Kida, MD       Or   metoCLOPramide (REGLAN) injection 5-10 mg  5-10 mg Intravenous Q8H PRN Yolonda Kida, MD       morphine (PF) 2 MG/ML injection 0.5-1 mg  0.5-1 mg Intravenous Q2H PRN Yolonda Kida, MD   1 mg at 08/31/23 0804   morphine (PF) 2 MG/ML injection 0.5-1 mg  0.5-1 mg Intravenous Q2H PRN Yolonda Kida, MD   1 mg at 09/01/23 1527   multivitamin with minerals tablet 1 tablet  1 tablet Oral Daily Yolonda Kida, MD   1 tablet at 09/02/23 0844   ondansetron (ZOFRAN) tablet 4 mg  4 mg Oral Q6H PRN Yolonda Kida, MD       Or   ondansetron [facility]) injection 4 mg  4 mg Intravenous Q6H PRN Yolonda Kida, MD       Oral care mouth rinse  15 mL Mouth Rinse PRN Regalado, Belkys A, MD       oxyCODONE (Oxy IR/ROXICODONE) immediate release tablet 5 mg  5 mg Oral Q4H PRN Yolonda Kida, MD   5 mg at 08/31/23 0303   polyethylene glycol (MIRALAX / GLYCOLAX) packet 17 g  17 g Oral Daily PRN Yolonda Kida, MD       prochlorperazine (COMPAZINE) injection 5 mg  5 mg Intravenous Q4H PRN Yolonda Kida, MD   5 mg at 08/31/23 0127   senna-docusate (Senokot-S)  tablet 1 tablet  1 tablet Oral BID Elgergawy, Leana Roe, MD   1 tablet at 09/02/23 0844   sertraline (ZOLOFT) tablet 50 mg  50 mg Oral Daily Yolonda Kida, MD   50 mg at 09/02/23 8469     Discharge Medications: Please see discharge summary for a list of discharge medications.  Relevant Imaging Results:  Relevant Lab Results:   Additional Information SSn: [id number] [place], [place]

## 2023-09-03 DIAGNOSIS — S72092A Other fracture of head and neck of left femur, initial encounter for closed fracture: Secondary | ICD-10-CM

## 2023-09-03 DIAGNOSIS — R55 Syncope and collapse: Secondary | ICD-10-CM | POA: Diagnosis not present

## 2023-09-03 LAB — CBC
HCT: 27.9 % — ABNORMAL LOW (ref 36.0–46.0)
Hemoglobin: 9.2 g/dL — ABNORMAL LOW (ref 12.0–15.0)
MCH: 30.6 pg (ref 26.0–34.0)
MCHC: 33 g/dL (ref 30.0–36.0)
MCV: 92.7 fL (ref 80.0–100.0)
Platelets: 160 10*3/uL (ref 150–400)
RBC: 3.01 MIL/uL — ABNORMAL LOW (ref 3.87–5.11)
RDW: 14.5 % (ref 11.5–15.5)
WBC: 6.6 10*3/uL (ref 4.0–10.5)
nRBC: 0 % (ref 0.0–0.2)

## 2023-09-03 LAB — BPAM RBC
Blood Product Expiration Date: 202409302359
ISSUE DATE / TIME: 202409230858
Unit Type and Rh: 5100

## 2023-09-03 LAB — TYPE AND SCREEN
ABO/RH(D): O POS
Antibody Screen: NEGATIVE
Unit division: 0

## 2023-09-03 LAB — BASIC METABOLIC PANEL
Anion gap: 12 (ref 5–15)
BUN: 7 mg/dL — ABNORMAL LOW (ref 8–23)
CO2: 33 mmol/L — ABNORMAL HIGH (ref 22–32)
Calcium: 9.5 mg/dL (ref 8.9–10.3)
Chloride: 95 mmol/L — ABNORMAL LOW (ref 98–111)
Creatinine, Ser: 0.52 mg/dL (ref 0.44–1.00)
GFR, Estimated: >60 mL/min (ref >60)
Glucose, Bld: 108 mg/dL — ABNORMAL HIGH (ref 70–99)
Potassium: 4.1 mmol/L (ref 3.5–5.1)
Sodium: 140 mmol/L (ref 135–145)

## 2023-09-03 NOTE — TOC Progression Note (Addendum)
Transition of Care [facility]) - Progression Note    Patient Details  Name: TARONDA KOKES MRN: [id number] Date of Birth: [birth year]  Transition of [facility]) CM/SW Contact  Mearl Latin, LCSW Phone Number: 09/03/2023, 9:18 AM  Clinical Narrative:    9:18am-CSW sent updated therapy notes to [facility] as requested as they reported concerns with patient not being able to ambulate with therapy.   CSW requested [facility] review referral as [facility] is not likely to have beds in time for patient discharge.   CSW left voicemail for Marylu Lund (daughter in law) with the other SNF offers.   12:55 PM-CSW received return call from [facility] and she stated they will review other offers but would like to hear back from [facility] and [facility] first. CSW made her aware patient is medically stable for discharge.    Expected Discharge Plan: Skilled Nursing Facility Barriers to Discharge: Continued Medical Work up, SNF Pending bed offer  Expected Discharge Plan and Services In-house Referral: Clinical Social Work   Post Acute Care Choice: Skilled Nursing Facility Living arrangements for the past 2 months: Single Family Home                                       Social Determinants of Health (SDOH) Interventions SDOH Screenings   Food Insecurity: Patient Declined (08/31/2023)  Housing: Low Risk  (08/31/2023)  Transportation Needs: Patient Declined (08/31/2023)  Utilities: Patient Declined (08/31/2023)  Tobacco Use: Medium Risk (08/31/2023)    Readmission Risk Interventions     No data to display

## 2023-09-03 NOTE — Progress Notes (Signed)
pt desatted on 2L O2 with short ambulation distance. She was at 82% on 2L and was unable to increase SpO2 with pursed lip breathing. I bumped pt to 4L with SpO2 at 90%. Back in the bed she was at 94% on 2L. Continued to encourage use of incentive spirometer.

## 2023-09-03 NOTE — Progress Notes (Signed)
PROGRESS NOTE    Katherine Garrison  VQQ:[id number] DOB: [birth year] DOA: 08/30/2023 PCP: Loyal Jacobson, MD   Brief Narrative:  82 year old with past medical history significant for hypertension, IBS, anxiety who presents with left lower extremity deformity pain after syncopal episode resulting in fall.  Patient has been heard a "thud", he found patient on the floor unconscious.  She regained consciousness 20 seconds after but does not remember how she fell.  Patient has had a prior history of syncope related to acute pain, nausea.  Patient has been doing well prior to this event.  Evaluation in the ED CT head and CT cervical spine negative.  Chest x-Sur negative.  X-Llerena of the hip demonstrate acute comminuted left intertrochanteric femur fracture.  In the ED patient had an acute episode of hypotension in the setting of severe pain, nausea which resolved with IV fluids.  Patient was seen by orthopedic, went for IM fixation with Dr. Aundria Rud 9/21, she required 1 unit PRBC transfusion on 9/23.     Assessment & Plan:   Principal Problem:   Closed left hip fracture, initial encounter [facility]) Active Problems:   Anxiety disorder   Essential hypertension   Syncope   Hypokalemia  Syncope, versus mild TBI: -Patient has a prior history of syncope neurally mediated by description.  She was in her usual state with no prior triggers or prodromal -LOC may have resulted from slip/fall with head injury. -Continue to monitor on Telemetry - ECHO. Normal EF 65 %, no regional wall motion abnormalities, grade 1 diastolic dysfunction.  -Troponin negative. EKG: sinus rhythm.  Monitor on telemetry.  No significant events so far  Left hip fracture: -Orthopedic consulted, this post left IM fixation with Dr. Aundria Rud today.   -PT/OT consulted, mentation for SNF placement -Continue with as needed pain medications  -Continue with subcu Lovenox for DVT prophylaxis during hospital stay, and aspirin 80 mg p.o. twice daily  for 6 weeks on discharge per orthopedic recommendation.  Acute blood loss anemia -postoperatively, received 1 unit PRBC 9/23, with good response continue to monitor CBC closely.  Hypoxia Atelectasis -Desaturating 85 to 86%, but no respiratory distress, no tachypnea, she was encouraged to use incentive spirometer, continue to provide oxygen, x-Henricksen significant for atelectasis with mild effusion, she was encouraged again today to use her incentive spirometer, provide oxygen as needed.  Hypertension: -Blood pressure initially soft, now started to increase, improved after resuming Diovan-hydrochlorothiazide, resume Norvasc tomorrow if continues to increase.,  Hypokalemia: -Replaced.   Mild Hyponatremia -On IV fluids.  -Monitor.   Hypomagnesemia:  -Replete IV>   Anxiety: -Continue Zoloft and BuSpar   Estimated body mass index is 21.63 kg/m as calculated from the following:   Height as of this encounter: 5\' 5"  (1.651 m).   Weight as of this encounter: 59 kg.   DVT prophylaxis: SCD, Lovenox Code Status: Full code Family Communication: None at bedside Disposition Plan:  Status is: Inpatient Remains inpatient appropriate because: management of hip fracture.     Consultants:  Orthopedic Dr Aundria Rud  Procedures:  ECHO  Antimicrobials:    Subjective: No significant events overnight as discussed with staff, she denies any complaints today. Objective: Vitals:   09/02/23 1946 09/02/23 2307 09/03/23 0312 09/03/23 0800  BP: (!) 157/99 (!) 143/70 124/62 (!) 157/67  Pulse: 96 86 86 91  Resp: (!) 21 14 13 12   Temp: 98.6 F (37 C) 98.5 F (36.9 C) 98.4 F (36.9 C) 98.3 F (36.8 C)  TempSrc: Oral Oral Oral  Oral  SpO2: 100% 100% 100% 100%  Weight:      Height:        Intake/Output Summary (Last 24 hours) at 09/03/2023 1134 Last data filed at 09/03/2023 1610 Gross per 24 hour  Intake --  Output 925 ml  Net -925 ml   Filed Weights   08/30/23 2147 08/30/23 2242 08/31/23  1305  Weight: 58.2 kg 59 kg 59 kg    Examination:  Awake Alert, Oriented X 3, she has left forehead and shoulder bruise from fall at home. Symmetrical Chest wall movement, Good air movement bilaterally, CTAB RRR,No Gallops,Rubs or new Murmurs, No Parasternal Heave +ve B.Sounds, Abd Soft, No tenderness, No rebound - guarding or rigidity. No Cyanosis, Clubbing or edema, No new Rash or bruise     CBC: Recent Labs  Lab 08/30/23 2150 08/31/23 0433 08/31/23 1636 09/01/23 0832 09/02/23 0524 09/03/23 0532  WBC 5.8 9.9 10.0 6.9 6.6 6.6  NEUTROABS 3.8  --   --   --   --   --   HGB 11.6* 10.7* 10.5* 9.1* 7.9* 9.2*  HCT 34.8* 32.8* 31.9* 27.1* 25.1* 27.9*  MCV 91.8 92.1 93.3 95.4 96.5 92.7  PLT 197 188 191 155 154 160   Basic Metabolic Panel: Recent Labs  Lab 08/30/23 2326 08/31/23 0433 08/31/23 1636 09/01/23 0832 09/02/23 0524 09/03/23 0532  NA 138 133*  --  137 139 140  K 3.3* 3.7  --  4.1 4.0 4.1  CL 97* 103  --  103 103 95*  CO2 21* 22  --  28 28 33*  GLUCOSE 132* 139*  --  120* 103* 108*  BUN 12 10  --  6* 7* 7*  CREATININE 0.62 0.54 0.74 0.60 0.47 0.52  CALCIUM 8.5* 8.2*  --  8.3* 8.5* 9.5  MG  --  1.6*  --   --   --   --    GFR: Estimated Creatinine Clearance: 48.8 mL/min (by C-G formula based on SCr of 0.52 mg/dL). Liver Function Tests: Recent Labs  Lab 08/30/23 2326  AST 27  ALT 19  ALKPHOS 39  BILITOT 0.6  PROT 5.7*  ALBUMIN 3.2*   No results for input(s): "LIPASE", "AMYLASE" in the last 168 hours. No results for input(s): "AMMONIA" in the last 168 hours. Coagulation Profile: No results for input(s): "INR", "PROTIME" in the last 168 hours. Cardiac Enzymes: No results for input(s): "CKTOTAL", "CKMB", "CKMBINDEX", "TROPONINI" in the last 168 hours. BNP (last 3 results) No results for input(s): "PROBNP" in the last 8760 hours. HbA1C: No results for input(s): "HGBA1C" in the last 72 hours. CBG: No results for input(s): "GLUCAP" in the last 168  hours. Lipid Profile: No results for input(s): "CHOL", "HDL", "LDLCALC", "TRIG", "CHOLHDL", "LDLDIRECT" in the last 72 hours. Thyroid Function Tests: No results for input(s): "TSH", "T4TOTAL", "FREET4", "T3FREE", "THYROIDAB" in the last 72 hours. Anemia Panel: Recent Labs    08/31/23 1636  RETICCTPCT 1.6   Sepsis Labs: No results for input(s): "PROCALCITON", "LATICACIDVEN" in the last 168 hours.  Recent Results (from the past 240 hour(s))  Surgical pcr screen     Status: None   Collection Time: 08/31/23 12:58 PM   Specimen: Nasal Mucosa; Nasal Swab  Result Value Ref Range Status   MRSA, PCR NEGATIVE NEGATIVE Final   Staphylococcus aureus NEGATIVE NEGATIVE Final    Comment: (NOTE) The Xpert SA Assay (FDA approved for NASAL specimens in patients 77 years of age and older), is one component of a  comprehensive surveillance program. It is not intended to diagnose infection nor to guide or monitor treatment. Performed at [facility], [address], [place], [place] [address]          Radiology Studies: DG Chest Port 1 View  Result Date: 09/02/2023 CLINICAL DATA:  Hypoxia EXAM: PORTABLE CHEST 1 VIEW COMPARISON:  08/30/2023 FINDINGS: Streaky left lung base opacity. Possible tiny left effusion. Stable cardiomediastinal silhouette. Calcified breast implants. Scoliosis of the spine. Old fracture deformity distal right clavicle. Postsurgical changes of the left humerus IMPRESSION: Streaky left lung base opacity, favor atelectasis over mild pneumonia. Possible tiny left effusion. Electronically Signed   By: Jasmine Pang M.D.   On: 09/02/2023 15:15        Scheduled Meds:  busPIRone  15 mg Oral TID   calcium carbonate  1 tablet Oral BID WC   enoxaparin (LOVENOX) injection  40 mg Subcutaneous Q24H   feeding supplement  237 mL Oral BID BM   fluticasone  1 spray Each Nare Daily   irbesartan  300 mg Oral Daily   And   hydrochlorothiazide  25 mg Oral Daily   multivitamin  with minerals  1 tablet Oral Daily   senna-docusate  1 tablet Oral BID   sertraline  50 mg Oral Daily   Continuous Infusions:  methocarbamol (ROBAXIN) IV       LOS: 3 days       Huey Bienenstock, MD [facility]   If 7PM-7AM, please contact night-coverage [email or web address]  09/03/2023, 11:34 AM

## 2023-09-03 NOTE — Care Management Important Message (Signed)
Important Message  Patient Details  Name: Katherine Garrison MRN: [id number] Date of Birth: [birth year]   Important Message Given:  Yes - Medicare IM     Dorena Bodo 09/03/2023, 3:26 PM

## 2023-09-03 NOTE — Progress Notes (Signed)
Physical Therapy Treatment Patient Details Name: Katherine Garrison MRN: [id number] DOB: 1941/11/10 Today's Date: 09/03/2023   History of Present Illness Katherine Garrison is a 82 y.o. female  who presents to the ED with left leg deformity after syncopal episode with fall resulting in L hip fracture; s/p ORIF, WBAT.  PMH: hypertension, IBS, and anxiety    PT Comments  Pt in recliner upon arrival with daughter present. Pt agreeable to PT session. Pt continues to require ModA to stand with cues for RW management and to bear weight on L LE. Pt with increased fatigue and dyspneic with ambulating on 2L O2 with 82%. Bumped to 4L [place] with 90%. Pt able to tolerate short distance of ambulation before requesting to return to the bed. Pt returned to 2L [place] at 94%. Pt is progressing slowly towards goals. Acute PT to follow.      If plan is discharge home, recommend the following: A lot of help with walking and/or transfers;A lot of help with bathing/dressing/bathroom;Help with stairs or ramp for entrance           Precautions / Restrictions Precautions Precautions: Fall Precaution Comments: watch O2 Restrictions Weight Bearing Restrictions: No LLE Weight Bearing: Weight bearing as tolerated (on L LE)     Mobility  Bed Mobility Overal bed mobility: Needs Assistance Bed Mobility: Sit to Supine       Sit to supine: HOB elevated, Min assist   General bed mobility comments: MinA for LE lift onto bed. Pt then able to move 1 LE at a time to comfortable position.    Transfers Overall transfer level: Needs assistance Equipment used: Rolling walker (2 wheels) Transfers: Sit to/from Stand Sit to Stand: Mod assist           General transfer comment: increased time, ModA for boost up, intial rise, and steady. Cues for hand placement with RW and to keep L LE under trunk    Ambulation/Gait Ambulation/Gait assistance: Contact guard assist Gait Distance (Feet): 16 Feet Assistive device: Rolling walker (2  wheels) Gait Pattern/deviations: Step-to pattern, Decreased stride length, Trunk flexed, Narrow base of support, Shuffle Gait velocity: dec     General Gait Details: very decreased gait speed with short step to pattern. Pt required x2 rest breaks in standing due to dyspnea. On 2L [place], pt desatted to 82% requiring bump to 4L with 90% SpO2. Pt reported fatigue with short distance and requested to return to bed. Back on 2L in supine with 94% SpO2.       Balance Overall balance assessment: Needs assistance Sitting-balance support: No upper extremity supported Sitting balance-Leahy Scale: Fair Sitting balance - Comments: in recliner   Standing balance support: Bilateral upper extremity supported, During functional activity, Reliant on assistive device for balance Standing balance-Leahy Scale: Poor Standing balance comment: reliant on RW for static stability         Cognition Arousal: Alert Behavior During Therapy: WFL for tasks assessed/performed Overall Cognitive Status: No family/caregiver present to determine baseline cognitive functioning Area of Impairment: Memory, Following commands, Problem solving      Memory: Decreased short-term memory Following Commands: Follows one step commands consistently     Problem Solving: Difficulty sequencing, Requires verbal cues, Slow processing General Comments: pt required multiple cues for RW management and reminders of what task she was about to perform               Pertinent Vitals/Pain Pain Assessment Faces Pain Scale: Hurts little more Pain Location: back  and LLE Pain Descriptors / Indicators: Discomfort, Grimacing, Guarding Pain Intervention(s): Limited activity within patient's tolerance, Monitored during session, Repositioned     PT Goals (current goals can now be found in the care plan section) Progress towards PT goals: Progressing toward goals    Frequency    Min 1X/week       AM-PAC PT "6 Clicks" Mobility    Outcome Measure  Help needed turning from your back to your side while in a flat bed without using bedrails?: A Little Help needed moving from lying on your back to sitting on the side of a flat bed without using bedrails?: A Lot Help needed moving to and from a bed to a chair (including a wheelchair)?: A Lot Help needed standing up from a chair using your arms (e.g., wheelchair or bedside chair)?: A Lot Help needed to walk in hospital room?: A Lot Help needed climbing 3-5 steps with a railing? : Total 6 Click Score: 12    End of Session Equipment Utilized During Treatment: Gait belt;Oxygen Activity Tolerance: No increased pain;Patient limited by fatigue Patient left: in bed;with family/visitor present;with call bell/phone within reach;with bed alarm set Nurse Communication: Mobility status (O2 demands) PT Visit Diagnosis: Unsteadiness on feet (R26.81);Other abnormalities of gait and mobility (R26.89)     Time: 6578-4696 PT Time Calculation (min) (ACUTE ONLY): 29 min  Charges:    $Gait Training: 8-22 mins $Therapeutic Activity: 8-22 mins PT General Charges $$ ACUTE PT VISIT: 1 Visit                     Hilton Cork, PT, DPT Secure Chat Preferred  Rehab Office [phone]    Arturo Morton Brion Aliment 09/03/2023, 1:08 PM

## 2023-09-04 ENCOUNTER — Encounter (HOSPITAL_COMMUNITY): Payer: Self-pay | Admitting: Orthopedic Surgery

## 2023-09-04 DIAGNOSIS — S72002A Fracture of unspecified part of neck of left femur, initial encounter for closed fracture: Secondary | ICD-10-CM | POA: Diagnosis not present

## 2023-09-04 LAB — BASIC METABOLIC PANEL
Anion gap: 9 (ref 5–15)
BUN: 11 mg/dL (ref 8–23)
CO2: 31 mmol/L (ref 22–32)
Calcium: 8.7 mg/dL — ABNORMAL LOW (ref 8.9–10.3)
Chloride: 97 mmol/L — ABNORMAL LOW (ref 98–111)
Creatinine, Ser: 0.5 mg/dL (ref 0.44–1.00)
GFR, Estimated: >60 mL/min (ref >60)
Glucose, Bld: 119 mg/dL — ABNORMAL HIGH (ref 70–99)
Potassium: 3.9 mmol/L (ref 3.5–5.1)
Sodium: 137 mmol/L (ref 135–145)

## 2023-09-04 LAB — CBC
HCT: 27.6 % — ABNORMAL LOW (ref 36.0–46.0)
Hemoglobin: 9 g/dL — ABNORMAL LOW (ref 12.0–15.0)
MCH: 30.5 pg (ref 26.0–34.0)
MCHC: 32.6 g/dL (ref 30.0–36.0)
MCV: 93.6 fL (ref 80.0–100.0)
Platelets: 182 10*3/uL (ref 150–400)
RBC: 2.95 MIL/uL — ABNORMAL LOW (ref 3.87–5.11)
RDW: 13.6 % (ref 11.5–15.5)
WBC: 6 10*3/uL (ref 4.0–10.5)
nRBC: 0 % (ref 0.0–0.2)

## 2023-09-04 MED ORDER — CARVEDILOL 3.125 MG PO TABS: 3.1250 mg | ORAL_TABLET | Freq: Two times a day (BID) | ORAL | Status: DC

## 2023-09-04 MED ORDER — CALCIUM CARBONATE 1250 (500 CA) MG PO TABS: 1.0000 | ORAL_TABLET | Freq: Every day | ORAL | Status: DC

## 2023-09-04 MED ORDER — AMLODIPINE BESYLATE 10 MG PO TABS: 10.0000 mg | ORAL_TABLET | Freq: Every day | ORAL | Status: DC

## 2023-09-04 MED ORDER — BISACODYL 5 MG PO TBEC: 5.0000 mg | DELAYED_RELEASE_TABLET | Freq: Every day | ORAL | Status: AC | PRN

## 2023-09-04 MED ORDER — MORPHINE SULFATE (PF) 2 MG/ML IV SOLN: 1.0000 mg | Freq: Three times a day (TID) | INTRAVENOUS | Status: DC | PRN

## 2023-09-04 MED ORDER — AMLODIPINE BESYLATE 10 MG PO TABS
10.0000 mg | ORAL_TABLET | Freq: Every day | ORAL | Status: DC
  Administered 2023-09-04: 10 mg via ORAL
  Filled 2023-09-04: qty 1

## 2023-09-04 NOTE — TOC Progression Note (Addendum)
Transition of Care [facility]) - Progression Note    Patient Details  Name: Katherine Garrison MRN: [id number] Date of Birth: 08/22/1941  Transition of Care [facility]) CM/SW Contact  Mearl Latin, LCSW Phone Number: 09/04/2023, 8:44 AM  Clinical Narrative:    8:44am-CSW spoke with Dru at [facility] and she stated they had a change with one of their residents so they now cannot accept patient.   [facility] still reviewing and will let CSW know shortly.   9:46 AM-[facility] able to accept patient tomorrow with an out of pocket $44/day charge. CSW updated Marylu Lund and MD.   10:54 AM-Now [facility] able to accept patient today. MD and family updated.    Expected Discharge Plan: Skilled Nursing Facility Barriers to Discharge: Continued Medical Work up, SNF Pending bed offer  Expected Discharge Plan and Services In-house Referral: Clinical Social Work   Post Acute Care Choice: Skilled Nursing Facility Living arrangements for the past 2 months: Single Family Home                                       Social Determinants of Health (SDOH) Interventions SDOH Screenings   Food Insecurity: Patient Declined (08/31/2023)  Housing: Low Risk  (08/31/2023)  Transportation Needs: Patient Declined (08/31/2023)  Utilities: Patient Declined (08/31/2023)  Tobacco Use: Medium Risk (08/31/2023)    Readmission Risk Interventions     No data to display

## 2023-09-04 NOTE — Progress Notes (Signed)
PT Cancellation Note  Patient Details Name: Katherine Garrison MRN: [id number] DOB: 1941-04-14   Cancelled Treatment:    Reason Eval/Treat Not Completed: Fatigue/lethargy limiting ability to participate. Pt declines PT session stating she was been moving all morning and needs to rest. Acute PT to follow as able.   Hilton Cork, PT, DPT Secure Chat Preferred  Rehab Office [phone]   Arturo Morton Brion Aliment 09/04/2023, 11:32 AM

## 2023-09-04 NOTE — Discharge Summary (Signed)
Katherine Garrison ZOX:[id number] DOB: 12-18-1940 DOA: 08/30/2023  PCP: Loyal Jacobson, MD  Admit date: 08/30/2023  Discharge date: 09/04/2023  Admitted From: Home   Disposition:  SNF   Recommendations for Outpatient Follow-up:   Follow up with PCP in 1-2 weeks  PCP Please obtain BMP/CBC, 2 view CXR in 1week,  (see Discharge instructions)   PCP Please follow up on the following pending results:    Home Health: None   Equipment/Devices: None  Consultations: Ortho Discharge Condition: Stable    CODE STATUS: Full    Diet Recommendation: Heart Healthy     Chief Complaint  Patient presents with   Fall   Hip Pain     Brief history of present illness from the day of admission and additional interim summary    82 year old with past medical history significant for hypertension, IBS, anxiety who presents with left lower extremity deformity pain after syncopal episode resulting in fall.  Patient has been heard a "thud", he found patient on the floor unconscious.  She regained consciousness 20 seconds after but does not remember how she fell.  Patient has had a prior history of syncope related to acute pain, nausea.  Patient has been doing well prior to this event.   Evaluation in the ED CT head and CT cervical spine negative.  Chest x-[place]britton negative.  X-Berrong of the hip demonstrate acute comminuted left intertrochanteric femur fracture.  In the ED patient had an acute episode of hypotension in the setting of severe pain, nausea which resolved with IV fluids.  Patient was seen by orthopedic, went for IM fixation with Dr. Aundria Rud 9/21, she required 1 unit PRBC transfusion on 9/23.                                                                 Hospital Course   Syncope, versus mild TBI: -Patient has a prior history of syncope  neurally mediated by description.  She was in her usual state with no prior triggers or prodromal -LOC may have resulted from slip/fall with head injury. -Continue to monitor on Telemetry - ECHO. Normal EF 65 %, no regional wall motion abnormalities, grade 1 diastolic dysfunction.  -Troponin negative. EKG: sinus rhythm.  Stable on telemetry, continue PT OT at SNF and monitor orthostatics intermittently.   Left hip fracture: -Orthopedic consulted, this post left IM fixation with Dr. Aundria Rud on 08/31/2023 -PT/OT consulted, mentation for SNF placement -Continue with as needed pain medications  -Continue with subcu Lovenox for DVT prophylaxis during hospital stay, and aspirin 81 mg p.o. twice daily for 6 weeks on discharge per orthopedic recommendation.  Weightbearing as tolerated, post discharge follow-up with Dr. Aundria Rud in 3 to 4 weeks.   Acute blood loss anemia -postoperatively, received 1 unit PRBC 9/23, with good response continue to  monitor CBC closely.   Hypoxia Atelectasis -Due to atelectasis encouraged to sit in chair use I-S every hour while awake, this problem has completely resolved now on room air.   Hypertension: -Blood pressure initially soft, now started to increase, improved after resuming Diovan-hydrochlorothiazide, added Norvasc and monitor.   Hypokalemia: -Replaced.    Mild Hyponatremia -On IV fluids.  -Monitor.    Hypomagnesemia:  -Replete IV>    Anxiety: -Continue Zoloft and BuSpar    Discharge diagnosis     Principal Problem:   Closed left hip fracture, initial encounter [facility]) Active Problems:   Anxiety disorder   Essential hypertension   Syncope   Hypokalemia    Discharge instructions    Discharge Instructions     Discharge instructions   Complete by: As directed    Orthopedic surgery discharge instructions:  -Okay for full weightbearing as tolerated to the left lower extremity.  -Maintain postoperative bandages until follow-up appointment  in 2 weeks with Dr. Aundria Rud.  -Apply ice liberally throughout the day to the left hip as well as take Tylenol and Advil around-the-clock for mild to moderate pain.  For any breakthrough pain use Norco as necessary.  -For the prevention of deep vein thrombosis (DVT), take an 81 mg aspirin twice per day x 6 weeks.   Follow with Primary MD Loyal Jacobson, MD in 7 days   Get CBC, CMP,  Magnesium -  checked next visit with your primary MD or SNF MD   Activity: Left leg weightbearing as tolerated with Full fall precautions use walker/cane & assistance as needed  Disposition SNF  Diet: Heart Healthy   Special Instructions: If you have smoked or chewed Tobacco  in the last 2 yrs please stop smoking, stop any regular Alcohol  and or any Recreational drug use.  On your next visit with your primary care physician please Get Medicines reviewed and adjusted.  Please request your Prim.MD to go over all Hospital Tests and Procedure/Radiological results at the follow up, please get all Hospital records sent to your Prim MD by signing hospital release before you go home.  If you experience worsening of your admission symptoms, develop shortness of breath, life threatening emergency, suicidal or homicidal thoughts you must seek medical attention immediately by calling 911 or calling your MD immediately  if symptoms less severe.  You Must read complete instructions/literature along with all the possible adverse reactions/side effects for all the Medicines you take and that have been prescribed to you. Take any new Medicines after you have completely understood and accpet all the possible adverse reactions/side effects.   Do not drive when taking Pain medications.  Do not take more than prescribed Pain, Sleep and Anxiety Medications       Discharge Medications   Allergies as of 09/04/2023       Reactions   Latex    Neosporin [neomycin-bacitracin Zn-polymyx] Rash   Sulfa Antibiotics Rash         Medication List     TAKE these medications    acetaminophen 500 MG tablet Commonly known as: TYLENOL Take 500 mg by mouth as needed for moderate pain or mild pain.   ALIGN PO Take by mouth.   amLODipine 10 MG tablet Commonly known as: NORVASC Take 1 tablet (10 mg total) by mouth daily. Start taking on: September 05, 2023 What changed:  medication strength how much to take   aspirin EC 81 MG tablet Take 1 tablet (81 mg total) by mouth in the  morning and at bedtime. Swallow whole.   bisacodyl 5 MG EC tablet Commonly known as: Dulcolax Take 1 tablet (5 mg total) by mouth daily as needed for moderate constipation.   busPIRone 15 MG tablet Commonly known as: BUSPAR Take 15 mg by mouth 2 (two) times daily.   calcium carbonate 1250 (500 Ca) MG tablet Commonly known as: OS-CAL - dosed in mg of elemental calcium Take 1 tablet (1,250 mg total) by mouth daily with breakfast.   fluticasone 50 MCG/ACT nasal spray Commonly known as: FLONASE   HYDROcodone-acetaminophen 5-325 MG tablet Commonly known as: Norco Take 1 tablet by mouth every 4 (four) hours as needed for moderate pain or severe pain.   MIRALAX PO Take by mouth.   potassium chloride 10 MEQ tablet Commonly known as: KLOR-CON M Take 10 mEq by mouth daily.   sertraline 50 MG tablet Commonly known as: ZOLOFT Take 50 mg by mouth daily.   valsartan-hydrochlorothiazide 320-25 MG tablet Commonly known as: DIOVAN-HCT Take 1 tablet by mouth daily.   VIACTIV PO Take by mouth.   WOMENS ONE DAILY PO Take by mouth.         Contact information for follow-up providers     Yolonda Kida, MD Follow up in 2 week(s).   Specialty: Orthopedic Surgery Why: For wound re-check Contact information: [address] [place] [address] [phone]         Loyal Jacobson, MD. Schedule an appointment as soon as possible for a visit in 1 week(s).   Specialty: Family Medicine Contact  information: [address] [place] [address] [phone]              Contact information for after-discharge care     Destination     [facility] .   Service: Skilled Nursing Contact information: [address] [place] [address] [phone]                     Major procedures and Radiology Reports - PLEASE review detailed and final reports thoroughly  -      DG Chest Port 1 View  Result Date: 09/02/2023 CLINICAL DATA:  Hypoxia EXAM: PORTABLE CHEST 1 VIEW COMPARISON:  08/30/2023 FINDINGS: Streaky left lung base opacity. Possible tiny left effusion. Stable cardiomediastinal silhouette. Calcified breast implants. Scoliosis of the spine. Old fracture deformity distal right clavicle. Postsurgical changes of the left humerus IMPRESSION: Streaky left lung base opacity, favor atelectasis over mild pneumonia. Possible tiny left effusion. Electronically Signed   By: Jasmine Pang M.D.   On: 09/02/2023 15:15   DG HIP UNILAT WITH PELVIS 2-3 VIEWS LEFT  Result Date: 08/31/2023 CLINICAL DATA:  [id number] Surgery, elective [id number] EXAM: DG HIP (WITH OR WITHOUT PELVIS) 2-3V LEFT COMPARISON:  X-Helvie 08/30/2023 FINDINGS: Four C-arm fluoroscopic images were obtained intraoperatively and submitted for post operative interpretation. Images demonstrate placement of left hip ORIF hardware traversing intertrochanteric fracture with improved alignment. 45 seconds of fluoroscopy time utilized. Radiation dose: 5.56 mGy. Please see the performing provider's procedural report for further detail. IMPRESSION: Intraoperative fluoroscopic guidance for left hip ORIF. Electronically Signed   By: Duanne Guess D.O.   On: 08/31/2023 18:29   DG C-Arm 1-60 Min-No Report  Result Date: 08/31/2023 Fluoroscopy was utilized by the requesting physician.  No radiographic interpretation.   ECHOCARDIOGRAM COMPLETE  Result Date: 08/31/2023     ECHOCARDIOGRAM REPORT   Patient Name:   Ceyda B Hersman Date of  Exam: 08/31/2023 Medical Rec #:  [id number]   Height:       65.0 in Accession #:    [id number]  Weight:       130.0 lb Date of Birth:  1941-10-11  BSA:          1.647 m Patient Age:    81 years    BP:           134/56 mmHg Patient Gender: F           HR:           93 bpm. Exam Location:  Inpatient Procedure: 2D Echo, Color Doppler and Cardiac Doppler Indications:    R55 Syncope, Pre-op clearance  History:        Patient has no prior history of Echocardiogram examinations.                 Risk Factors:Hypertension.  Sonographer:    Irving Burton Senior RDCS Referring Phys: [id number] TIMOTHY S OPYD  Sonographer Comments: Scanned supine due to hip fracture IMPRESSIONS  1. Left ventricular ejection fraction, by estimation, is 65 to 70%. The left ventricle has normal function. The left ventricle has no regional wall motion abnormalities. Left ventricular diastolic parameters are consistent with Grade I diastolic dysfunction (impaired relaxation).  2. Right ventricular systolic function is normal. The right ventricular size is normal. There is normal pulmonary artery systolic pressure. The estimated right ventricular systolic pressure is 30.2 mmHg.  3. The mitral valve is normal in structure. Trivial mitral valve regurgitation.  4. The aortic valve is tricuspid. Aortic valve regurgitation is not visualized.  5. The inferior vena cava is normal in size with greater than 50% respiratory variability, suggesting right atrial pressure of 3 mmHg. Comparison(s): No prior Echocardiogram. FINDINGS  Left Ventricle: Left ventricular ejection fraction, by estimation, is 65 to 70%. The left ventricle has normal function. The left ventricle has no regional wall motion abnormalities. The left ventricular internal cavity size was normal in size. There is  no left ventricular hypertrophy. Left ventricular diastolic parameters are consistent with Grade I diastolic dysfunction (impaired  relaxation). Indeterminate filling pressures. Right Ventricle: The right ventricular size is normal. No increase in right ventricular wall thickness. Right ventricular systolic function is normal. There is normal pulmonary artery systolic pressure. The tricuspid regurgitant velocity is 2.61 m/s, and  with an assumed right atrial pressure of 3 mmHg, the estimated right ventricular systolic pressure is 30.2 mmHg. Left Atrium: Left atrial size was normal in size. Right Atrium: Right atrial size was normal in size. Pericardium: There is no evidence of pericardial effusion. Mitral Valve: The mitral valve is normal in structure. Trivial mitral valve regurgitation. Tricuspid Valve: The tricuspid valve is normal in structure. Tricuspid valve regurgitation is mild. Aortic Valve: The aortic valve is tricuspid. Aortic valve regurgitation is not visualized. Pulmonic Valve: The pulmonic valve was normal in structure. Pulmonic valve regurgitation is not visualized. Aorta: The aortic root and ascending aorta are structurally normal, with no evidence of dilitation. Venous: The inferior vena cava is normal in size with greater than 50% respiratory variability, suggesting right atrial pressure of 3 mmHg. IAS/Shunts: No atrial level shunt detected by color flow Doppler.  LEFT VENTRICLE PLAX 2D LVIDd:         3.80 cm   Diastology LVIDs:         2.30 cm   LV e' medial:    7.72 cm/s LV PW:         0.80  cm   LV E/e' medial:  12.3 LV IVS:        0.90 cm   LV e' lateral:   10.10 cm/s LVOT diam:     1.80 cm   LV E/e' lateral: 9.4 LV SV:         58 LV SV Index:   35 LVOT Area:     2.54 cm  RIGHT VENTRICLE RV S prime:     14.90 cm/s TAPSE (M-mode): 1.9 cm LEFT ATRIUM             Index        RIGHT ATRIUM           Index LA diam:        2.40 cm 1.46 cm/m   RA Area:     14.10 cm LA Vol (A2C):   31.0 ml 18.82 ml/m  RA Volume:   34.30 ml  20.82 ml/m LA Vol (A4C):   31.4 ml 19.06 ml/m LA Biplane Vol: 31.9 ml 19.37 ml/m  AORTIC VALVE LVOT  Vmax:   108.00 cm/s LVOT Vmean:  80.400 cm/s LVOT VTI:    0.226 m  AORTA Ao Root diam: 2.90 cm Ao Asc diam:  2.80 cm MITRAL VALVE               TRICUSPID VALVE MV Area (PHT): 3.77 cm    TR Peak grad:   27.2 mmHg MV Decel Time: 201 msec    TR Vmax:        261.00 cm/s MV E velocity: 94.70 cm/s MV A velocity: 87.70 cm/s  SHUNTS MV E/A ratio:  1.08        Systemic VTI:  0.23 m                            Systemic Diam: 1.80 cm Zoila Shutter MD Electronically signed by Zoila Shutter MD Signature Date/Time: 08/31/2023/11:50:11 AM    Final    DG Femur Portable Min 2 Views Left  Result Date: 08/30/2023 CLINICAL DATA:  Status post fall. EXAM: LEFT FEMUR PORTABLE 2 VIEWS COMPARISON:  None Available. FINDINGS: There is an acute, comminuted fracture deformity involving the inter trochanteric region of the proximal left femur. There is no evidence of dislocation. Soft tissue swelling is seen at the previously noted fracture site. IMPRESSION: Acute, comminuted intertrochanteric fracture of the proximal left femur. Electronically Signed   By: Aram Candela M.D.   On: 08/30/2023 23:40   DG Knee Left Port  Result Date: 08/30/2023 CLINICAL DATA:  Status post fall. EXAM: PORTABLE LEFT KNEE - 1-2 VIEW COMPARISON:  None Available. FINDINGS: No evidence of fracture, dislocation, or joint effusion. Moderate severity tricompartmental joint space narrowing is seen. Mild medial and lateral chondrocalcinosis is also noted. There is mild medial soft tissue swelling. IMPRESSION: Moderate severity tricompartmental degenerative changes. Electronically Signed   By: Aram Candela M.D.   On: 08/30/2023 23:39   CT Cervical Spine Wo Contrast  Result Date: 08/30/2023 CLINICAL DATA:  Status post fall. EXAM: CT CERVICAL SPINE WITHOUT CONTRAST TECHNIQUE: Multidetector CT imaging of the cervical spine was performed without intravenous contrast. Multiplanar CT image reconstructions were also generated. RADIATION DOSE REDUCTION: This exam  was performed according to the departmental dose-optimization program which includes automated exposure control, adjustment of the mA and/or kV according to patient size and/or use of iterative reconstruction technique. COMPARISON:  None Available. FINDINGS: Alignment: Normal. Skull base and vertebrae: No acute fracture.  Chronic and degenerative changes seen involving the body and tip of the dens, as well as the adjacent portion of the anterior arch of C1. Soft tissues and spinal canal: No prevertebral fluid or swelling. No visible canal hematoma. Disc levels: Marked severity endplate sclerosis, anterior osteophyte formation and posterior bony spurring are seen at the levels of C4-C5, C5-C6, C6-C7 and C7-T1. There is marked severity narrowing of the anterior atlantoaxial articulation, with marked severity intervertebral disc space narrowing at the levels of C4-C5, C5-C6, C6-C7 and C7-T1. Bilateral marked severity multilevel facet joint hypertrophy is noted. Upper chest: There is mild to moderate severity biapical scarring and/or atelectasis. Other: None. IMPRESSION: Marked severity multilevel degenerative changes, as described above, without evidence of an acute fracture or subluxation. Electronically Signed   By: Aram Candela M.D.   On: 08/30/2023 23:37   CT HEAD WO CONTRAST ( )  Result Date: 08/30/2023 CLINICAL DATA:  Status post fall. EXAM: CT HEAD WITHOUT CONTRAST TECHNIQUE: Contiguous axial images were obtained from the base of the skull through the vertex without intravenous contrast. RADIATION DOSE REDUCTION: This exam was performed according to the departmental dose-optimization program which includes automated exposure control, adjustment of the mA and/or kV according to patient size and/or use of iterative reconstruction technique. COMPARISON:  May 13, 2020 FINDINGS: Brain: There is mild cerebral atrophy with widening of the extra-axial spaces and ventricular dilatation. There are areas of  decreased attenuation within the white matter tracts of the supratentorial brain, consistent with microvascular disease changes. 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 Vascular: No hyperdense vessel or unexpected calcification. Skull: Normal. Negative for fracture or focal lesion. Sinuses/Orbits: No acute finding. Other: None. IMPRESSION: 1. Generalized cerebral atrophy with mild, chronic white matter small vessel ischemic changes. 2. No acute intracranial abnormality. Electronically Signed   By: Aram Candela M.D.   On: 08/30/2023 23:33   DG Hip Port [place] W or [place] Pelvis 1 View Left  Result Date: 08/30/2023 CLINICAL DATA:  Status post fall. EXAM: DG HIP (WITH OR WITHOUT PELVIS) 1V PORT LEFT COMPARISON:  None Available. FINDINGS: There is an acute, comminuted fracture deformity extending through the inter trochanteric region of the proximal left femur. There is no evidence of dislocation. Mild to moderate severity degenerative changes seen involving both hips, in the form of joint space narrowing and acetabular sclerosis. IMPRESSION: Acute, comminuted intertrochanteric fracture of the proximal left femur. Electronically Signed   By: Aram Candela M.D.   On: 08/30/2023 23:31   DG Chest Port 1 View  Result Date: 08/30/2023 CLINICAL DATA:  Status post fall. EXAM: PORTABLE CHEST 1 VIEW COMPARISON:  October 31, 2019 FINDINGS: The heart size and mediastinal contours are within normal limits. Both lungs are clear. Partially calcified bilateral breast implants are noted. A chronic fracture deformity is seen involving the distal right clavicle, with a radiopaque intramedullary rod and fixation screws seen within the proximal to mid left humerus. There is mild S-shaped scoliosis of the thoracolumbar spine. IMPRESSION: Stable chronic and postoperative changes without evidence of acute cardiopulmonary disease. Electronically Signed   By: Aram Candela M.D.   On: 08/30/2023 23:30     Micro Results    Recent Results (from the past 240 hour(s))  Surgical pcr screen     Status: None   Collection Time: 08/31/23 12:58 PM   Specimen: Nasal Mucosa; Nasal Swab  Result Value Ref Range Status   MRSA, PCR NEGATIVE NEGATIVE Final   Staphylococcus aureus NEGATIVE NEGATIVE Final  Comment: (NOTE) The Xpert SA Assay (FDA approved for NASAL specimens in patients 78 years of age and older), is one component of a comprehensive surveillance program. It is not intended to diagnose infection nor to guide or monitor treatment. Performed at [facility], [address], [place], [place] [address]     Today   Subjective    Ernestina Flash today has no headache,no chest abdominal pain,no new weakness tingling or numbness, feels much better wants to go home today.    Objective   Blood pressure (!) 159/73, pulse 95, temperature 98.5 F (36.9 C), temperature source Oral, resp. rate 13, height 5\' 5"  (1.651 m), weight 59 kg, SpO2 93%.   Intake/Output Summary (Last 24 hours) at 09/04/2023 1025 Last data filed at 09/04/2023 0559 Gross per 24 hour  Intake --  Output 850 ml  Net -850 ml    Exam  Awake Alert, No new F.N deficits,    [place].AT,PERRAL Supple Neck,   Symmetrical Chest wall movement, Good air movement bilaterally, CTAB RRR,No Gallops,   +ve B.Sounds, Abd Soft, Non tender,  Left postop hip site under bandage and stable, no surrounding hematoma.   Data Review   Recent Labs  Lab 08/30/23 2150 08/31/23 0433 08/31/23 1636 09/01/23 0832 09/02/23 0524 09/03/23 0532 09/04/23 0625  WBC 5.8   < > 10.0 6.9 6.6 6.6 6.0  HGB 11.6*   < > 10.5* 9.1* 7.9* 9.2* 9.0*  HCT 34.8*   < > 31.9* 27.1* 25.1* 27.9* 27.6*  PLT 197   < > 191 155 154 160 182  MCV 91.8   < > 93.3 95.4 96.5 92.7 93.6  MCH 30.6   < > 30.7 32.0 30.4 30.6 30.5  MCHC 33.3   < > 32.9 33.6 31.5 33.0 32.6  RDW 12.4   < > 12.7 12.8 12.9 14.5 13.6  LYMPHSABS 1.4  --   --   --   --   --   --   MONOABS 0.4   --   --   --   --   --   --   EOSABS 0.0  --   --   --   --   --   --   BASOSABS 0.0  --   --   --   --   --   --    < > = values in this interval not displayed.    Recent Labs  Lab 08/30/23 2326 08/31/23 0433 08/31/23 1636 09/01/23 0832 09/02/23 0524 09/03/23 0532 09/04/23 0625  NA 138 133*  --  137 139 140 137  K 3.3* 3.7  --  4.1 4.0 4.1 3.9  CL 97* 103  --  103 103 95* 97*  CO2 21* 22  --  28 28 33* 31  ANIONGAP 20* 8  --  6 8 12 9   GLUCOSE 132* 139*  --  120* 103* 108* 119*  BUN 12 10  --  6* 7* 7* 11  CREATININE 0.62 0.54 0.74 0.60 0.47 0.52 0.50  AST 27  --   --   --   --   --   --   ALT 19  --   --   --   --   --   --   ALKPHOS 39  --   --   --   --   --   --   BILITOT 0.6  --   --   --   --   --   --  ALBUMIN 3.2*  --   --   --   --   --   --   MG  --  1.6*  --   --   --   --   --   CALCIUM 8.5* 8.2*  --  8.3* 8.5* 9.5 8.7*    Total Time in preparing paper work, data evaluation and todays exam - 35 minutes  Signature  -    Susa Raring M.D on 09/04/2023 at 10:25 AM   -  To page go to [email or web address]

## 2023-09-04 NOTE — TOC Transition Note (Signed)
Transition of Care [facility]) - CM/SW Discharge Note   Patient Details  Name: Katherine Garrison MRN: [id number] Date of Birth: 10-29-1941  Transition of Care [facility]) CM/SW Contact:  Mearl Latin, LCSW Phone Number: 09/04/2023, 12:47 PM   Clinical Narrative:    Patient will DC to: [facility] Anticipated DC date: 09/04/23 Family notified: Spouse and Marylu Lund Transport by: Sharin Mons   Per MD patient ready for DC to [facility]. RN to call report prior to discharge [phone] room 102). RN, patient, patient's family, and facility notified of DC. Discharge Summary and FL2 sent to facility. DC packet on chart including signed scripts. Ambulance transport requested for patient.   CSW will sign off for now as social work intervention is no longer needed. Please consult [place] again if new needs arise.     Final next level of care: Skilled Nursing Facility Barriers to Discharge: Barriers Resolved   Patient Goals and CMS Choice CMS Medicare.gov Compare Post Acute Care list provided to:: Patient Choice offered to / list presented to : Patient  Discharge Placement     Existing PASRR number confirmed : 09/04/23          Patient chooses bed at: [facility] Patient to be transferred to facility by: [facility] Name of family member notified: Spouse Patient and family notified of of transfer: 09/04/23  Discharge Plan and Services Additional resources added to the After Visit Summary for   In-house Referral: Clinical Social Work   Post Acute Care Choice: Skilled Nursing Facility                               Social Determinants of Health (SDOH) Interventions SDOH Screenings   Food Insecurity: Patient Declined (08/31/2023)  Housing: Low Risk  (08/31/2023)  Transportation Needs: Patient Declined (08/31/2023)  Utilities: Patient Declined (08/31/2023)  Tobacco Use: Medium Risk (08/31/2023)     Readmission Risk Interventions     No data to display

## 2023-09-04 NOTE — Progress Notes (Signed)
PROGRESS NOTE    Katherine Garrison  GYI:[id number] DOB: 17-Oct-1941 DOA: 08/30/2023 PCP: Loyal Jacobson, MD   Brief Narrative:  82 year old with past medical history significant for hypertension, IBS, anxiety who presents with left lower extremity deformity pain after syncopal episode resulting in fall.  Patient has been heard a "thud", he found patient on the floor unconscious.  She regained consciousness 20 seconds after but does not remember how she fell.  Patient has had a prior history of syncope related to acute pain, nausea.  Patient has been doing well prior to this event.  Evaluation in the ED CT head and CT cervical spine negative.  Chest x-[place] negative.  X-Cirillo of the hip demonstrate acute comminuted left intertrochanteric femur fracture.  In the ED patient had an acute episode of hypotension in the setting of severe pain, nausea which resolved with IV fluids.  Patient was seen by orthopedic, went for IM fixation with Dr. Aundria Rud 9/21, she required 1 unit PRBC transfusion on 9/23.     Assessment & Plan:   yncope, versus mild TBI: -Patient has a prior history of syncope neurally mediated by description.  She was in her usual state with no prior triggers or prodromal -LOC may have resulted from slip/fall with head injury. -Continue to monitor on Telemetry - ECHO. Normal EF 65 %, no regional wall motion abnormalities, grade 1 diastolic dysfunction.  -Troponin negative. EKG: sinus rhythm.  Monitor on telemetry.  No significant events so far  Left hip fracture: -Orthopedic consulted, this post left IM fixation with Dr. Aundria Rud on 08/31/2023 -PT/OT consulted, mentation for SNF placement -Continue with as needed pain medications  -Continue with subcu Lovenox for DVT prophylaxis during hospital stay, and aspirin 81 mg p.o. twice daily for 6 weeks on discharge per orthopedic recommendation.  Acute blood loss anemia -postoperatively, received 1 unit PRBC 9/23, with good response continue to  monitor CBC closely.  Hypoxia Atelectasis -Due to atelectasis encouraged to sit in chair use I-S every hour while awake, advance activity and titrate down oxygen as tolerated  Hypertension: -Blood pressure initially soft, now started to increase, improved after resuming Diovan-hydrochlorothiazide, add Norvasc and monitor.  Hypokalemia: -Replaced.   Mild Hyponatremia -On IV fluids.  -Monitor.   Hypomagnesemia:  -Replete IV>   Anxiety: -Continue Zoloft and BuSpar   Estimated body mass index is 21.63 kg/m as calculated from the following:   Height as of this encounter: 5\' 5"  (1.651 m).   Weight as of this encounter: 59 kg.   DVT prophylaxis: SCD, Lovenox Code Status: Full code Family Communication: None at bedside Disposition Plan:  Status is: Inpatient Remains inpatient appropriate because: management of hip fracture.     Consultants:  Orthopedic Dr Aundria Rud  Procedures:  ECHO  Antimicrobials:    Subjective: No significant events overnight as discussed with staff, she denies any complaints today. Objective: Vitals:   09/03/23 2010 09/03/23 2331 09/04/23 0331 09/04/23 0800  BP: (!) 166/67 139/61 (!) 142/64 (!) 159/73  Pulse: 78 82 92 95  Resp: 15 12 13 13   Temp: 98.2 F (36.8 C) 98.2 F (36.8 C) 98.2 F (36.8 C) 98.5 F (36.9 C)  TempSrc: Oral Oral Oral Oral  SpO2: 97% 96% 97% 93%  Weight:      Height:        Intake/Output Summary (Last 24 hours) at 09/04/2023 0920 Last data filed at 09/04/2023 0559 Gross per 24 hour  Intake --  Output 850 ml  Net -850 ml  Filed Weights   08/30/23 2147 08/30/23 2242 08/31/23 1305  Weight: 58.2 kg 59 kg 59 kg    Examination:  Awake Alert, No new F.N deficits, Normal affect [place].AT,PERRAL Supple Neck, No JVD,   Symmetrical Chest wall movement, Good air movement bilaterally, CTAB RRR,No Gallops, Rubs or new Murmurs,  +ve B.Sounds, Abd Soft, No tenderness,   Left hip postop site under bandage and stable, no  surrounding hematoma    CBC:  Recent Labs  Lab 08/30/23 2150 08/31/23 0433 08/31/23 1636 09/01/23 0832 09/02/23 0524 09/03/23 0532 09/04/23 0625  WBC 5.8   < > 10.0 6.9 6.6 6.6 6.0  HGB 11.6*   < > 10.5* 9.1* 7.9* 9.2* 9.0*  HCT 34.8*   < > 31.9* 27.1* 25.1* 27.9* 27.6*  PLT 197   < > 191 155 154 160 182  MCV 91.8   < > 93.3 95.4 96.5 92.7 93.6  MCH 30.6   < > 30.7 32.0 30.4 30.6 30.5  MCHC 33.3   < > 32.9 33.6 31.5 33.0 32.6  RDW 12.4   < > 12.7 12.8 12.9 14.5 13.6  LYMPHSABS 1.4  --   --   --   --   --   --   MONOABS 0.4  --   --   --   --   --   --   EOSABS 0.0  --   --   --   --   --   --   BASOSABS 0.0  --   --   --   --   --   --    < > = values in this interval not displayed.    Recent Labs  Lab 08/30/23 2326 08/31/23 0433 08/31/23 1636 09/01/23 0832 09/02/23 0524 09/03/23 0532 09/04/23 0625  NA 138 133*  --  137 139 140 137  K 3.3* 3.7  --  4.1 4.0 4.1 3.9  CL 97* 103  --  103 103 95* 97*  CO2 21* 22  --  28 28 33* 31  ANIONGAP 20* 8  --  6 8 12 9   GLUCOSE 132* 139*  --  120* 103* 108* 119*  BUN 12 10  --  6* 7* 7* 11  CREATININE 0.62 0.54 0.74 0.60 0.47 0.52 0.50  AST 27  --   --   --   --   --   --   ALT 19  --   --   --   --   --   --   ALKPHOS 39  --   --   --   --   --   --   BILITOT 0.6  --   --   --   --   --   --   ALBUMIN 3.2*  --   --   --   --   --   --   MG  --  1.6*  --   --   --   --   --   CALCIUM 8.5* 8.2*  --  8.3* 8.5* 9.5 8.7*    No results found for: "CHOL", "HDL", "LDLCALC", "LDLDIRECT", "TRIG", "CHOLHDL"    Recent Labs  Lab 08/31/23 0433 09/01/23 0832 09/02/23 0524 09/03/23 0532 09/04/23 0625  MG 1.6*  --   --   --   --   CALCIUM 8.2* 8.3* 8.5* 9.5 8.7*      Radiology Studies: DG Chest Port 1 View  Result Date: 09/02/2023  CLINICAL DATA:  Hypoxia EXAM: PORTABLE CHEST 1 VIEW COMPARISON:  08/30/2023 FINDINGS: Streaky left lung base opacity. Possible tiny left effusion. Stable cardiomediastinal silhouette. Calcified  breast implants. Scoliosis of the spine. Old fracture deformity distal right clavicle. Postsurgical changes of the left humerus IMPRESSION: Streaky left lung base opacity, favor atelectasis over mild pneumonia. Possible tiny left effusion. Electronically Signed   By: Jasmine Pang M.D.   On: 09/02/2023 15:15     Scheduled Meds:  amLODipine  10 mg Oral Daily   busPIRone  15 mg Oral TID   calcium carbonate  1 tablet Oral BID WC   enoxaparin (LOVENOX) injection  40 mg Subcutaneous Q24H   feeding supplement  237 mL Oral BID BM   fluticasone  1 spray Each Nare Daily   irbesartan  300 mg Oral Daily   And   hydrochlorothiazide  25 mg Oral Daily   multivitamin with minerals  1 tablet Oral Daily   senna-docusate  1 tablet Oral BID   sertraline  50 mg Oral Daily   Continuous Infusions:  methocarbamol (ROBAXIN) IV       LOS: 4 days   Signature  -    Susa Raring M.D on 09/04/2023 at 9:20 AM   -  To page go to [email or web address]

## 2023-09-04 NOTE — Progress Notes (Signed)
Attempted to call report to facility per Cristobal Goldmann, LCSW at [phone] (Room #102).  Unable to speak to anyone, voicemail left for return phone call.

## 2023-09-04 NOTE — Progress Notes (Signed)
Discharge report called to receiving nurse Joni Reining, LPN at [facility] (Rm #[phone]).  Discharge packet prepared by Matthew Folks.

## 2025-01-11 ENCOUNTER — Emergency Department (HOSPITAL_BASED_OUTPATIENT_CLINIC_OR_DEPARTMENT_OTHER)

## 2025-01-11 ENCOUNTER — Encounter (HOSPITAL_BASED_OUTPATIENT_CLINIC_OR_DEPARTMENT_OTHER): Payer: Self-pay

## 2025-01-11 ENCOUNTER — Other Ambulatory Visit: Payer: Self-pay

## 2025-01-11 ENCOUNTER — Inpatient Hospital Stay (HOSPITAL_BASED_OUTPATIENT_CLINIC_OR_DEPARTMENT_OTHER)
Admission: EM | Admit: 2025-01-11 | Discharge: 2025-01-13 | DRG: 177 | Disposition: A | Attending: Internal Medicine | Admitting: Internal Medicine

## 2025-01-11 DIAGNOSIS — J9601 Acute respiratory failure with hypoxia: Secondary | ICD-10-CM | POA: Diagnosis present

## 2025-01-11 DIAGNOSIS — I10 Essential (primary) hypertension: Secondary | ICD-10-CM | POA: Diagnosis present

## 2025-01-11 DIAGNOSIS — R531 Weakness: Secondary | ICD-10-CM

## 2025-01-11 DIAGNOSIS — Z883 Allergy status to other anti-infective agents status: Secondary | ICD-10-CM

## 2025-01-11 DIAGNOSIS — Z882 Allergy status to sulfonamides status: Secondary | ICD-10-CM

## 2025-01-11 DIAGNOSIS — Z8673 Personal history of transient ischemic attack (TIA), and cerebral infarction without residual deficits: Secondary | ICD-10-CM

## 2025-01-11 DIAGNOSIS — Z7902 Long term (current) use of antithrombotics/antiplatelets: Secondary | ICD-10-CM

## 2025-01-11 DIAGNOSIS — U071 COVID-19: Principal | ICD-10-CM | POA: Diagnosis present

## 2025-01-11 DIAGNOSIS — R0902 Hypoxemia: Secondary | ICD-10-CM

## 2025-01-11 DIAGNOSIS — Z79899 Other long term (current) drug therapy: Secondary | ICD-10-CM

## 2025-01-11 DIAGNOSIS — Z87891 Personal history of nicotine dependence: Secondary | ICD-10-CM

## 2025-01-11 DIAGNOSIS — Z66 Do not resuscitate: Secondary | ICD-10-CM | POA: Diagnosis present

## 2025-01-11 DIAGNOSIS — Z91040 Latex allergy status: Secondary | ICD-10-CM

## 2025-01-11 DIAGNOSIS — N3000 Acute cystitis without hematuria: Secondary | ICD-10-CM | POA: Diagnosis present

## 2025-01-11 DIAGNOSIS — Z90710 Acquired absence of both cervix and uterus: Secondary | ICD-10-CM

## 2025-01-11 LAB — URINALYSIS, W/ REFLEX TO CULTURE (INFECTION SUSPECTED)
Bilirubin Urine: NEGATIVE
Glucose, UA: NEGATIVE mg/dL
Ketones, ur: 40 mg/dL — AB
Nitrite: NEGATIVE
Protein, ur: 30 mg/dL — AB
Specific Gravity, Urine: 1.02 (ref 1.005–1.030)
pH: 6 (ref 5.0–8.0)

## 2025-01-11 LAB — CBC WITH DIFFERENTIAL/PLATELET
Abs Immature Granulocytes: 0.02 10*3/uL (ref 0.00–0.07)
Basophils Absolute: 0 10*3/uL (ref 0.0–0.1)
Basophils Relative: 0 %
Eosinophils Absolute: 0 10*3/uL (ref 0.0–0.5)
Eosinophils Relative: 0 %
HCT: 37.7 % (ref 36.0–46.0)
Hemoglobin: 12.6 g/dL (ref 12.0–15.0)
Immature Granulocytes: 0 %
Lymphocytes Relative: 15 %
Lymphs Abs: 1 10*3/uL (ref 0.7–4.0)
MCH: 28.9 pg (ref 26.0–34.0)
MCHC: 33.4 g/dL (ref 30.0–36.0)
MCV: 86.5 fL (ref 80.0–100.0)
Monocytes Absolute: 1 10*3/uL (ref 0.1–1.0)
Monocytes Relative: 14 %
Neutro Abs: 4.9 10*3/uL (ref 1.7–7.7)
Neutrophils Relative %: 71 %
Platelets: 156 10*3/uL (ref 150–400)
RBC: 4.36 MIL/uL (ref 3.87–5.11)
RDW: 13.3 % (ref 11.5–15.5)
WBC: 7 10*3/uL (ref 4.0–10.5)
nRBC: 0 % (ref 0.0–0.2)

## 2025-01-11 LAB — COMPREHENSIVE METABOLIC PANEL WITH GFR
ALT: 94 U/L — ABNORMAL HIGH (ref 0–44)
AST: 74 U/L — ABNORMAL HIGH (ref 15–41)
Albumin: 4.1 g/dL (ref 3.5–5.0)
Alkaline Phosphatase: 97 U/L (ref 38–126)
Anion gap: 12 (ref 5–15)
BUN: 16 mg/dL (ref 8–23)
CO2: 26 mmol/L (ref 22–32)
Calcium: 9 mg/dL (ref 8.9–10.3)
Chloride: 98 mmol/L (ref 98–111)
Creatinine, Ser: 0.62 mg/dL (ref 0.44–1.00)
GFR, Estimated: >60 mL/min
Glucose, Bld: 100 mg/dL — ABNORMAL HIGH (ref 70–99)
Potassium: 3.6 mmol/L (ref 3.5–5.1)
Sodium: 136 mmol/L (ref 135–145)
Total Bilirubin: 0.8 mg/dL (ref 0.0–1.2)
Total Protein: 7 g/dL (ref 6.5–8.1)

## 2025-01-11 LAB — RESP PANEL BY RT-PCR (RSV, FLU A&B, COVID)  RVPGX2
Influenza A by PCR: NEGATIVE
Influenza B by PCR: NEGATIVE
Resp Syncytial Virus by PCR: NEGATIVE
SARS Coronavirus 2 by RT PCR: POSITIVE — AB

## 2025-01-11 LAB — LACTIC ACID, PLASMA: Lactic Acid, Venous: 0.9 mmol/L (ref 0.5–1.9)

## 2025-01-11 MED ORDER — ACETAMINOPHEN 650 MG RE SUPP: 650.0000 mg | Freq: Four times a day (QID) | RECTAL | Status: DC | PRN

## 2025-01-11 MED ORDER — ATORVASTATIN CALCIUM 40 MG PO TABS
40.0000 mg | ORAL_TABLET | Freq: Every day | ORAL | Status: DC
  Administered 2025-01-12 – 2025-01-13 (×3): 40 mg via ORAL
  Filled 2025-01-11 (×3): qty 1

## 2025-01-11 MED ORDER — AMLODIPINE BESYLATE 5 MG PO TABS
5.0000 mg | ORAL_TABLET | Freq: Every day | ORAL | Status: DC
  Administered 2025-01-12 – 2025-01-13 (×2): 5 mg via ORAL
  Filled 2025-01-11 (×2): qty 1

## 2025-01-11 MED ORDER — ONDANSETRON HCL 4 MG PO TABS: 4.0000 mg | ORAL_TABLET | Freq: Four times a day (QID) | ORAL | Status: DC | PRN

## 2025-01-11 MED ORDER — ONDANSETRON HCL 4 MG/2ML IJ SOLN: 4.0000 mg | Freq: Four times a day (QID) | INTRAMUSCULAR | Status: DC | PRN

## 2025-01-11 MED ORDER — ENOXAPARIN SODIUM 40 MG/0.4ML IJ SOSY
40.0000 mg | PREFILLED_SYRINGE | INTRAMUSCULAR | Status: DC
  Administered 2025-01-12 – 2025-01-13 (×2): 40 mg via SUBCUTANEOUS
  Filled 2025-01-11 (×2): qty 0.4

## 2025-01-11 MED ORDER — ACETAMINOPHEN 325 MG PO TABS: 650.0000 mg | ORAL_TABLET | Freq: Four times a day (QID) | ORAL | Status: DC | PRN

## 2025-01-11 MED ORDER — BISACODYL 5 MG PO TBEC: 5.0000 mg | DELAYED_RELEASE_TABLET | Freq: Every day | ORAL | Status: DC | PRN

## 2025-01-11 MED ORDER — SODIUM CHLORIDE 0.9 % IV SOLN
1.0000 g | Freq: Once | INTRAVENOUS | Status: AC
  Administered 2025-01-11: 1 g via INTRAVENOUS
  Filled 2025-01-11: qty 10

## 2025-01-11 MED ORDER — SERTRALINE HCL 25 MG PO TABS
50.0000 mg | ORAL_TABLET | Freq: Every day | ORAL | Status: DC
  Administered 2025-01-12 – 2025-01-13 (×2): 50 mg via ORAL
  Filled 2025-01-11 (×2): qty 2

## 2025-01-11 MED ORDER — CLOPIDOGREL BISULFATE 75 MG PO TABS
75.0000 mg | ORAL_TABLET | Freq: Every day | ORAL | Status: DC
  Administered 2025-01-12 – 2025-01-13 (×2): 75 mg via ORAL
  Filled 2025-01-11 (×2): qty 1

## 2025-01-11 MED ORDER — ASPIRIN 81 MG PO CHEW
81.0000 mg | CHEWABLE_TABLET | Freq: Every day | ORAL | Status: DC
  Administered 2025-01-12 – 2025-01-13 (×2): 81 mg via ORAL
  Filled 2025-01-11 (×2): qty 1

## 2025-01-11 MED ORDER — ONDANSETRON HCL 4 MG/2ML IJ SOLN
4.0000 mg | Freq: Once | INTRAMUSCULAR | Status: AC
  Administered 2025-01-11: 4 mg via INTRAVENOUS
  Filled 2025-01-11: qty 2

## 2025-01-11 MED ORDER — SODIUM CHLORIDE 0.9 % IV BOLUS
1000.0000 mL | Freq: Once | INTRAVENOUS | Status: AC
  Administered 2025-01-11: 1000 mL via INTRAVENOUS

## 2025-01-11 MED ORDER — SENNOSIDES-DOCUSATE SODIUM 8.6-50 MG PO TABS: 1.0000 | ORAL_TABLET | Freq: Every evening | ORAL | Status: DC | PRN

## 2025-01-11 MED ORDER — BUSPIRONE HCL 15 MG PO TABS
15.0000 mg | ORAL_TABLET | Freq: Two times a day (BID) | ORAL | Status: DC
  Administered 2025-01-12 – 2025-01-13 (×3): 15 mg via ORAL
  Filled 2025-01-11 (×2): qty 1
  Filled 2025-01-11: qty 3
  Filled 2025-01-11: qty 1
  Filled 2025-01-11 (×2): qty 3
  Filled 2025-01-11: qty 1

## 2025-01-11 NOTE — ED Triage Notes (Signed)
 Sore throat, cough, congestion, N/V, fever, fatigue since yesterday  Denies chest pain or shob

## 2025-01-11 NOTE — ED Provider Notes (Signed)
 " [place] [facility] Provider Note   CSN: [id number] Arrival date & time: 01/11/25  1529     Patient presents with: Cough   Katherine Garrison is a 84 y.o. female.   The history is provided by the patient, medical records and the spouse. No language interpreter was used.  Cough Cough characteristics:  Productive Sputum characteristics:  Nondescript Severity:  Moderate Onset quality:  Gradual Duration:  3 days Timing:  Constant Progression:  Waxing and waning Chronicity:  New Relieved by:  Nothing Worsened by:  Nothing Ineffective treatments:  None tried Associated symptoms: chills, rhinorrhea, sinus congestion and sore throat   Associated symptoms: no chest pain, no diaphoresis, no fever, no headaches, no myalgias, no rash, no shortness of breath and no wheezing   Emesis Severity:  Moderate Duration:  2 days Timing:  Intermittent Progression:  Unchanged Chronicity:  New Associated symptoms: chills, cough, diarrhea, sore throat and URI   Associated symptoms: no abdominal pain, no fever, no headaches and no myalgias        Prior to Admission medications  Medication Sig Start Date End Date Taking? Authorizing Provider  acetaminophen  (TYLENOL ) 500 MG tablet Take 500 mg by mouth as needed for moderate pain or mild pain.    [provider]  amLODipine  (NORVASC ) 10 MG tablet Take 1 tablet (10 mg total) by mouth daily. 09/05/23   Dennise Lavada POUR, MD  busPIRone  (BUSPAR ) 15 MG tablet Take 15 mg by mouth 2 (two) times daily.    [provider]  calcium  carbonate (OS-CAL - DOSED IN MG OF ELEMENTAL CALCIUM ) 1250 (500 Ca) MG tablet Take 1 tablet (1,250 mg total) by mouth daily with breakfast. 09/04/23   Singh, Prashant K, MD  Calcium -Vitamin D-Vitamin K (VIACTIV PO) Take by mouth.    [provider]  fluticasone  (FLONASE ) 50 MCG/ACT nasal spray  07/04/22   [provider]  HYDROcodone -acetaminophen  (NORCO) 5-325 MG  tablet Take 1 tablet by mouth every 4 (four) hours as needed for moderate pain or severe pain. 09/02/23   McClung, Kevan D, PA  Multiple Vitamins-Minerals (WOMENS ONE DAILY PO) Take by mouth.    [provider]  Polyethylene Glycol 3350  (MIRALAX  PO) Take by mouth.    [provider]  potassium chloride  (KLOR-CON  M) 10 MEQ tablet Take 10 mEq by mouth daily. 07/04/22   [provider]  Probiotic Product (ALIGN PO) Take by mouth.    [provider]  sertraline  (ZOLOFT ) 50 MG tablet Take 50 mg by mouth daily.    [provider]  valsartan -hydrochlorothiazide  (DIOVAN -HCT) 320-25 MG per tablet Take 1 tablet by mouth daily.    [provider]    Allergies: Latex, Neosporin [neomycin-bacitracin zn-polymyx], and Sulfa antibiotics    Review of Systems  Constitutional:  Positive for chills and fatigue. Negative for diaphoresis and fever.  HENT:  Positive for congestion, rhinorrhea and sore throat.   Respiratory:  Positive for cough. Negative for chest tightness, shortness of breath, wheezing and stridor.   Cardiovascular:  Negative for chest pain, palpitations and leg swelling.  Gastrointestinal:  Positive for diarrhea, nausea and vomiting. Negative for abdominal pain and constipation.  Genitourinary:  Positive for frequency. Negative for dysuria and flank pain.  Musculoskeletal:  Negative for back pain, myalgias, neck pain and neck stiffness.  Skin:  Negative for rash and wound.  Neurological:  Negative for weakness, light-headedness, numbness and headaches.  Psychiatric/Behavioral:  Negative for agitation and confusion.  All other systems reviewed and are negative.   Updated Vital Signs BP (!) 137/92 (BP Location: Right Arm)   Pulse 92   Temp 97.9 F (36.6 C) (Oral)   Resp 20   SpO2 97%   Physical Exam Vitals and nursing note reviewed.  Constitutional:      General: She is not in acute distress.    Appearance: She is well-developed. She  is not ill-appearing, toxic-appearing or diaphoretic.  HENT:     Head: Normocephalic and atraumatic.     Nose: Rhinorrhea present.     Mouth/Throat:     Mouth: Mucous membranes are dry.     Pharynx: No oropharyngeal exudate or posterior oropharyngeal erythema.  Eyes:     Extraocular Movements: Extraocular movements intact.     Conjunctiva/sclera: Conjunctivae normal.     Pupils: Pupils are equal, round, and reactive to light.  Neck:     Vascular: No carotid bruit.  Cardiovascular:     Rate and Rhythm: Normal rate and regular rhythm.     Pulses: Normal pulses.     Heart sounds: No murmur heard. Pulmonary:     Effort: Pulmonary effort is normal. No respiratory distress.     Breath sounds: Normal breath sounds. No wheezing, rhonchi or rales.  Chest:     Chest wall: No tenderness.  Abdominal:     General: Abdomen is flat.     Palpations: Abdomen is soft.     Tenderness: There is no abdominal tenderness.  Musculoskeletal:        General: No swelling or tenderness.     Cervical back: Neck supple. No tenderness.     Right lower leg: No edema.     Left lower leg: No edema.  Skin:    General: Skin is warm and dry.     Capillary Refill: Capillary refill takes less than 2 seconds.     Findings: No erythema or rash.  Neurological:     General: No focal deficit present.     Mental Status: She is alert.     Sensory: No sensory deficit.     Motor: No weakness.  Psychiatric:        Mood and Affect: Mood normal.     (all labs ordered are listed, but only abnormal results are displayed) Labs Reviewed  RESP PANEL BY RT-PCR (RSV, FLU A&B, COVID)  [id number] - Abnormal; Notable for the following components:      Result Value   SARS Coronavirus 2 by RT PCR POSITIVE (*)    All other components within normal limits  COMPREHENSIVE METABOLIC PANEL WITH GFR - Abnormal; Notable for the following components:   Glucose, Bld 100 (*)    AST 74 (*)    ALT 94 (*)    All other components within  normal limits  URINALYSIS, W/ REFLEX TO CULTURE (INFECTION SUSPECTED) - Abnormal; Notable for the following components:   Hgb urine dipstick TRACE (*)    Ketones, ur 40 (*)    Protein, ur 30 (*)    Leukocytes,Ua SMALL (*)    Bacteria, UA RARE (*)    All other components within normal limits  CBC WITH DIFFERENTIAL/PLATELET  LACTIC ACID, PLASMA  BASIC METABOLIC PANEL WITH GFR  CBC    EKG: None  Radiology: DG Chest Portable 1 View Result Date: 01/11/2025 CLINICAL DATA:  Cough, COVID positive EXAM: PORTABLE CHEST 1 VIEW COMPARISON:  09/02/2023 FINDINGS: Single frontal view of the chest demonstrates an unremarkable cardiac silhouette. No acute airspace  disease, effusion, or pneumothorax. No acute displaced fracture. Densely calcified bilateral breast prostheses are again noted. Prior left humeral ORIF. IMPRESSION: 1. No acute intrathoracic process. Electronically Signed   By: Ozell Daring M.D.   On: 01/11/2025 16:44     Procedures   Medications Ordered in the ED  acetaminophen  (TYLENOL ) tablet 650 mg (has no administration in time range)    Or  acetaminophen  (TYLENOL ) suppository 650 mg (has no administration in time range)  senna-docusate (Senokot-S) tablet 1 tablet (has no administration in time range)  bisacodyl  (DULCOLAX) EC tablet 5 mg (has no administration in time range)  ondansetron  (ZOFRAN ) tablet 4 mg (has no administration in time range)    Or  ondansetron  (ZOFRAN ) injection 4 mg (has no administration in time range)  enoxaparin  (LOVENOX ) injection 40 mg (has no administration in time range)  sodium chloride  0.9 % bolus 1,000 mL (0 mLs Intravenous Stopped 01/11/25 1915)  ondansetron  (ZOFRAN ) injection 4 mg (4 mg Intravenous Given 01/11/25 1659)  cefTRIAXone  (ROCEPHIN ) 1 g in sodium chloride  0.9 % 100 mL IVPB (0 g Intravenous Stopped 01/11/25 2030)                                    Medical Decision Making Amount and/or Complexity of Data Reviewed Labs:  ordered. Radiology: ordered.  Risk Prescription drug management. Decision regarding hospitalization.    Katherine Garrison is a 84 y.o. female with a past medical history significant for hypertension, irritable bowel syndrome, osteopenia, previous hernia repair, previous diverticulitis, and history of ocular hypertension who presents with URI symptoms.  According to patient, for the last few days she has developed rhinorrhea, congestion, sore throat, cough, subjective chills, nausea, vomiting, fatigue, and urinary frequency.  Patient is denying chest pain or shortness of breath but has had this cough and malaise and fatigue for several days.  She is unsure where she got it from.  No one else is at with her.  She reports no chest pain or palpitations or shortness of breath but is having some of the nausea and vomiting overnight that was quite frequent.  She also reports she was having  difficulty controlling her urine because she kept having to pee and not make it to the bathroom in time.  She denies focal dysuria however.  She denies any constipation but did have some loose stools.  Denies any syncopal episodes and did not report any neurologic complaints.  On exam, lungs clear.  Chest nontender.  Abdomen nontender.  Back nontender.  Neck nontender.  Mucous membranes are dry.  No focal neurologic deficits with intact sensation strength and pulses in extremities.  Patient otherwise well-appearing but she had some rhinorrhea.  We had a shared decision, sick as the patient was feeling yesterday with the nausea and vomiting and urinary symptoms and this cough, we agreed to get some labs and imaging.  Patient was found to be COVID-positive and this is likely causing her symptoms however we will give her some fluids and nausea medicine to try to make her feel better and get some screening labs to look for significant electrolyte disturbance or organ dysfunction.  Will get chest x-Alamillo to rule out pneumonia and  urinalysis to rule out UTI as well.  Anticipate discharge if her vital signs remain reassuring and her workup is also reassuring other than COVID.   7:33 PM Patient's workup began to return.  Her urinalysis  does show leukocytes and bacteria so we are going to give antibiotics for possible UTI given her frequency however when I went to reassess the patient in the context of her COVID, her oxygen saturation was 87% on room air and she was more short of breath.  Given the hypoxia with COVID-19 infection despite no pneumonia, I do feel she needs admission for hypoxia and COVID.  Will call for admission for her for this.     Final diagnoses:  COVID-19  Hypoxia  Acute cystitis without hematuria     Clinical Impression: 1. COVID-19   2. Hypoxia   3. Acute cystitis without hematuria     Disposition: Admit  This note was prepared with assistance of Dragon voice recognition software. Occasional wrong-word or sound-a-like substitutions may have occurred due to the inherent limitations of voice recognition software.     Maybelle Depaoli, Lonni PARAS, MD 01/11/25 2303  "

## 2025-01-11 NOTE — ED Notes (Signed)
Ambulatory to bathroom with walker

## 2025-01-12 ENCOUNTER — Encounter (HOSPITAL_COMMUNITY): Payer: Self-pay | Admitting: Student

## 2025-01-12 DIAGNOSIS — R531 Weakness: Secondary | ICD-10-CM

## 2025-01-12 DIAGNOSIS — Z8673 Personal history of transient ischemic attack (TIA), and cerebral infarction without residual deficits: Secondary | ICD-10-CM

## 2025-01-12 DIAGNOSIS — J9601 Acute respiratory failure with hypoxia: Secondary | ICD-10-CM

## 2025-01-12 LAB — BASIC METABOLIC PANEL WITH GFR
Anion gap: 7 (ref 5–15)
BUN: 13 mg/dL (ref 8–23)
CO2: 27 mmol/L (ref 22–32)
Calcium: 8.3 mg/dL — ABNORMAL LOW (ref 8.9–10.3)
Chloride: 104 mmol/L (ref 98–111)
Creatinine, Ser: 0.55 mg/dL (ref 0.44–1.00)
GFR, Estimated: >60 mL/min
Glucose, Bld: 117 mg/dL — ABNORMAL HIGH (ref 70–99)
Potassium: 4 mmol/L (ref 3.5–5.1)
Sodium: 138 mmol/L (ref 135–145)

## 2025-01-12 LAB — MAGNESIUM: Magnesium: 2 mg/dL (ref 1.7–2.4)

## 2025-01-12 LAB — CBC
HCT: 34.1 % — ABNORMAL LOW (ref 36.0–46.0)
Hemoglobin: 11 g/dL — ABNORMAL LOW (ref 12.0–15.0)
MCH: 28.4 pg (ref 26.0–34.0)
MCHC: 32.3 g/dL (ref 30.0–36.0)
MCV: 87.9 fL (ref 80.0–100.0)
Platelets: 140 10*3/uL — ABNORMAL LOW (ref 150–400)
RBC: 3.88 MIL/uL (ref 3.87–5.11)
RDW: 13.4 % (ref 11.5–15.5)
WBC: 4.4 10*3/uL (ref 4.0–10.5)
nRBC: 0 % (ref 0.0–0.2)

## 2025-01-12 LAB — PHOSPHORUS: Phosphorus: 2.6 mg/dL (ref 2.5–4.6)

## 2025-01-12 MED ORDER — GUAIFENESIN 100 MG/5ML PO LIQD
5.0000 mL | ORAL | Status: DC | PRN
  Administered 2025-01-12: 5 mL via ORAL
  Filled 2025-01-12: qty 10

## 2025-01-12 MED ORDER — LOSARTAN POTASSIUM 50 MG PO TABS
50.0000 mg | ORAL_TABLET | Freq: Every day | ORAL | Status: DC
  Administered 2025-01-12 – 2025-01-13 (×2): 50 mg via ORAL
  Filled 2025-01-12 (×2): qty 1

## 2025-01-12 MED ORDER — GUAIFENESIN-DM 100-10 MG/5ML PO SYRP
15.0000 mL | ORAL_SOLUTION | Freq: Four times a day (QID) | ORAL | Status: DC
  Administered 2025-01-12 – 2025-01-13 (×3): 15 mL via ORAL
  Filled 2025-01-12 (×5): qty 15

## 2025-01-12 MED ORDER — FLUTICASONE PROPIONATE 50 MCG/ACT NA SUSP: 2.0000 | Freq: Every day | NASAL | Status: DC

## 2025-01-12 MED ORDER — IPRATROPIUM-ALBUTEROL 0.5-2.5 (3) MG/3ML IN SOLN: 3.0000 mL | Freq: Four times a day (QID) | RESPIRATORY_TRACT | Status: DC | PRN

## 2025-01-12 MED ORDER — IPRATROPIUM-ALBUTEROL 0.5-2.5 (3) MG/3ML IN SOLN: 3.0000 mL | RESPIRATORY_TRACT | Status: DC | PRN

## 2025-01-12 MED ORDER — DEXAMETHASONE 6 MG PO TABS
6.0000 mg | ORAL_TABLET | ORAL | Status: DC
  Administered 2025-01-12 – 2025-01-13 (×3): 6 mg via ORAL
  Filled 2025-01-12: qty 2
  Filled 2025-01-12 (×2): qty 1

## 2025-01-12 MED ORDER — HYDROXYZINE HCL 25 MG PO TABS
25.0000 mg | ORAL_TABLET | Freq: Once | ORAL | Status: AC
  Administered 2025-01-12: 25 mg via ORAL
  Filled 2025-01-12: qty 1

## 2025-01-12 MED ORDER — SODIUM CHLORIDE 0.9 % IV SOLN: INTRAVENOUS | Status: DC

## 2025-01-12 MED ORDER — MELATONIN 3 MG PO TABS: 3.0000 mg | ORAL_TABLET | Freq: Every evening | ORAL | Status: DC | PRN

## 2025-01-12 MED ORDER — IPRATROPIUM-ALBUTEROL 0.5-2.5 (3) MG/3ML IN SOLN
3.0000 mL | Freq: Four times a day (QID) | RESPIRATORY_TRACT | Status: DC
  Administered 2025-01-12: 3 mL via RESPIRATORY_TRACT
  Filled 2025-01-12: qty 3

## 2025-01-12 NOTE — Hospital Course (Addendum)
 CC: cough, N/V, fever, fatigue HPI: Katherine Garrison is a 84 y.o. female with medical history significant for hypertension, CVA, IBS, diverticulitis, chronic constipation, osteoarthritis, anxiety and recent CVA in 11/2024 currently undergoing outpatient therapy who presented to the ED for evaluation of URI symptoms.  Patient reports she has been going to outpatient PT a few times a week due to her recent stroke in December. 2 to 3 days ago, she started having chills, sinus congestion and sore throat. She had 3 episodes of emesis 2 nights ago. She reports a productive cough and generalized weakness but denies any chest pain, fevers, abdominal pain, dysuria, headache or lightheadedness   ED Course: Initial vitals show afebrile, RR 18, HR 70-90s, SBP 120-160s, SpO2 80% on room air, O2 sat dropped to the mid 80s and placed on 2 L [place]. Initial labs significant for positive SARS-CoV-2 by PCR, normal CBC, AST/ALT 70/94, normal renal function, normal lactic acid, UA shows negative nitrite, small leuks, WBC 6-10 and rare bacteria. CXR shows no active disease. Pt received IV NS 1 L bolus, IV Zofran  and IV Rocephin . TRH was consulted for admission and patient was admitted virtually.   Significant Events: Admitted 01/11/2025 for acute hypoxic respiratory failure due to Covid-19 01-11-2025 EDP notes documents RA sats of 87% in ER.  Admission Labs: Covid Positive, rsv and influenza Negative WBC 7.0, HgB 12.6, plt 156 Na 136, K 3.6, CO2 of 26, BUN 16, scr 0.62, glu 100 T. Prot 7.0, AST 74, ALT 94, alk phos 97, t. Bili 0.8 Lactic acid 0.9 UA negative nitrite, small LE   Admission Imaging Studies: CXR No acute intrathoracic process.   Significant Labs:   Significant Imaging Studies:   Antibiotic Therapy: Anti-infectives (From admission, onward)    Start     Dose/Rate Route Frequency Ordered Stop   01/11/25 1945  cefTRIAXone  (ROCEPHIN ) 1 g in sodium chloride  0.9 % 100 mL IVPB        1 g 200 mL/hr over 30  Minutes Intravenous  Once 01/11/25 1934 01/11/25 2030       Procedures:   Consultants:

## 2025-01-12 NOTE — Assessment & Plan Note (Signed)
 01/12/25 not sick enough to start remdesivir. Continue with decadron  6 mg daily. Change duonebs to QID. Continue supportive care. Pt lives at home with her husband.

## 2025-01-12 NOTE — Evaluation (Signed)
 Physical Therapy Brief Evaluation and Discharge Note Patient Details Name: Katherine Garrison MRN: [id number] DOB: May 23, 1941 Today's Date: 01/12/2025   History of Present Illness  Pt is 84 yo presenting to [facility] on 2/2 due to upper respiratory infection symptoms. Pt found to have CODI. PMH: IBS, osteopenia, previous hernia repair, diverticulitis and ocular HTN.  Clinical Impression  Pt is currently presenting at  supervision for functional mobility without an AD and Mod I for functional mobility with a RW. Pt would benefit from use of RW at home and was educated on use of RW until balance improves; pt states understanding. Pt will benefit from continued mobility while in the hospital by mobility team and nursing staff but due to pt is at baseline level of functioning does not have needs for acute physical therapy services at this time. Pt will be discharged from acute physical therapy services at this time but will benefit from skilled physical therapy services 3x/week on discharge home in order to address balance to decrease risk for falls.      PT Assessment All further PT needs can be met in the next venue of care  Assistance Needed at Discharge  Set up Supervision/Assistance    Equipment Recommendations Rolling walker (2 wheels)     Precautions/Restrictions Precautions Precautions: Fall Recall of Precautions/Restrictions: Impaired Restrictions Weight Bearing Restrictions Per Provider Order: No        Mobility  Bed Mobility       General bed mobility comments: Pt up walking in the room with nurse tech when I arrived and in recliner at end of session.  Transfers Overall transfer level: Needs assistance Equipment used: None, Rolling walker (2 wheels) Transfers: Sit to/from Stand Sit to Stand: Supervision, Modified independent (Device/Increase time)           General transfer comment: supervision without RW and Mod I with RW.    Ambulation/Gait Ambulation/Gait assistance:  Modified independent (Device/Increase time), Contact guard assist Gait Distance (Feet): 60 Feet Assistive device: None, Rolling walker (2 wheels) Gait Pattern/deviations: Step-through pattern, Decreased stride length, Drifts right/left Gait Speed: Below normal General Gait Details: without AD 1x LOB requiring CGA to maintain balance. Improved with RW. Pt advised to use RW at home until her balance improves and pt stated understanding.  Stairs Stairs:  (pt demonstrates good strength with sit to stand and gait in order to navigate stairs per home set up wtih minimal to CGA from family)             Balance Overall balance assessment: Needs assistance Sitting-balance support: Single extremity supported, Feet supported Sitting balance-Leahy Scale: Good     Standing balance support: Bilateral upper extremity supported, During functional activity, Reliant on assistive device for balance Standing balance-Leahy Scale: Fair Standing balance comment: needs UE Support without it pt had LOB          Pertinent Vitals/Pain   Pain Assessment Pain Assessment: No/denies pain     Home Living Family/patient expects to be discharged to:: Private residence Living Arrangements: Spouse/significant other Available Help at Discharge: Available 24 hours/day;Family (children) Home Environment: Stairs to enter;Rail - left (has a bar not a rail)  Stairs-Number of Steps: 2 Home Equipment: [employer] (2 wheels)        Prior Function Level of Independence: Independent with assistive device(s) Comments: states she has not been using the AD all the time recently.    UE/LE Assessment   UE ROM/Strength/Tone/Coordination: [facility]    LE ROM/Strength/Tone/Coordination: [facility]  Communication   Communication Communication: No apparent difficulties     Cognition Overall Cognitive Status: Appears within functional limits for tasks assessed/performed       General Comments General comments  (skin integrity, edema, etc.): No signs/symptoms of cardiac/respiratory distress during session.        Assessment/Plan    PT Problem List Decreased balance         AMPAC 6 Clicks Help needed turning from your back to your side while in a flat bed without using bedrails?: None Help needed moving from lying on your back to sitting on the side of a flat bed without using bedrails?: None Help needed moving to and from a bed to a chair (including a wheelchair)?: None Help needed standing up from a chair using your arms (e.g., wheelchair or bedside chair)?: None Help needed to walk in hospital room?: A Little Help needed climbing 3-5 steps with a railing? : A Little 6 Click Score: 22      End of Session Equipment Utilized During Treatment: Gait belt Activity Tolerance: Patient tolerated treatment well Patient left: in chair;with call bell/phone within reach;with chair alarm set Nurse Communication: Mobility status       Time: 8780-8764 PT Time Calculation (min) (ACUTE ONLY): 16 min  Charges:   PT Evaluation $PT Eval Low Complexity: 1 Low     Dorothyann Maier, DPT, CLT  Acute Rehabilitation Services Office: [phone] (Secure chat preferred)   Dorothyann VEAR Maier  01/12/2025, 1:25 PM

## 2025-01-12 NOTE — Assessment & Plan Note (Addendum)
 01/12/25 EDP notes documents RA sats of 87% in ER. Pt not on home O2. Wean to RA.

## 2025-01-12 NOTE — Assessment & Plan Note (Addendum)
 01/12/25 PT eval

## 2025-01-12 NOTE — Plan of Care (Signed)

## 2025-01-12 NOTE — Progress Notes (Signed)
 OT Cancellation Note  Patient Details Name: Katherine Garrison MRN: [id number] DOB: 1941-10-21   Cancelled Treatment:    Reason Eval/Treat Not Completed: (P) OT screened, no needs identified, will sign off, per PT Pt is at baseline, OT signing off.   [name] 01/12/2025, 3:49 PM

## 2025-01-12 NOTE — Subjective & Objective (Signed)
 Pt seen and examined. Transferred from [facility] for covid and hypoxia. Pt still coughing but feeling some better. She would like to go home soon. Wean O2. Pt ate hamburger and fries for lunch. No N/V.

## 2025-01-12 NOTE — TOC Initial Note (Signed)
 Transition of Care [facility]) - Initial/Assessment Note    Patient Details  Name: Katherine Garrison MRN: [id number] Date of Birth: May 25, 1941  [facility] CM/SW Contact:    Marval Gell, RN Phone Number: 01/12/2025, 4:06 PM  Clinical Narrative:                  [facility] w patient at bedside. Her RW from home is her room, please make sure it gets sent home with her.  We discussed offers frm [facility] agencies and reviewed ratings. [facility] chosen, and accepted. Added to HUB and AVS    Expected Discharge Plan: Home w Home Health Services Barriers to Discharge: Continued Medical Work up   Patient Goals and CMS Choice Patient states their goals for this hospitalization and ongoing recovery are:: to go home          Expected Discharge Plan and Services                                              Prior Living Arrangements/Services                       Activities of Daily Living   ADL Screening (condition at time of admission) Independently performs ADLs?: No Does the patient have a NEW difficulty with bathing/dressing/toileting/self-feeding that is expected to last >3 days?: No Does the patient have a NEW difficulty with getting in/out of bed, walking, or climbing stairs that is expected to last >3 days?: No Does the patient have a NEW difficulty with communication that is expected to last >3 days?: No  Permission Sought/Granted                  Emotional Assessment              Admission diagnosis:  Hypoxia [R09.02] Acute cystitis without hematuria [N30.00] COVID-19 [U07.1] Patient Active Problem List   Diagnosis Date Noted   Acute hypoxic respiratory failure (HCC) 01/12/2025   History of CVA (cerebrovascular accident) 01/12/2025   Generalized weakness 01/12/2025   COVID-19 01/11/2025   Sacroiliac joint pain 04/20/2023   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   Rectal prolapse 09/29/2020   Dermatochalasis of upper and lower eyelids of  both eyes 08/23/2020   Aortic atherosclerosis 11/18/2018   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   Osteopenia 09/24/2016   Anxiety disorder 11/21/2015   Allergic rhinitis 11/21/2015   Colon polyps 12/21/2013   Irritable bowel syndrome 01/08/2013   Chronic idiopathic constipation 10/06/2012   Essential hypertension 08/21/2012   Hemorrhoid 07/25/2012   PCP:  Millicent Sharper, MD Pharmacy:   [facility] [phone] - [place], [place] - [address] AT [facility] [address] [place] [address] Phone: [phone] Fax: [phone]  [facility] #[id number] - [place], [place] - [address] AT [address] [place]  [address] Phone: [phone] Fax: [phone]     Social Drivers of Health (SDOH) Social History: SDOH Screenings   Food Insecurity: Patient Declined (01/12/2025)  Housing: Unknown (01/12/2025)  Transportation Needs: Patient Declined (01/12/2025)  Utilities: Patient Declined (01/12/2025)  Social Connections: Unknown (01/12/2025)  Tobacco Use: Medium Risk (01/11/2025)   SDOH Interventions:     Readmission Risk Interventions     No  data to display

## 2025-01-13 MED ORDER — DEXAMETHASONE 6 MG PO TABS: 6.0000 mg | ORAL_TABLET | Freq: Every day | ORAL | 0 refills | Status: AC

## 2025-01-13 NOTE — Discharge Summary (Signed)
 Physician Discharge Summary  Katherine Garrison FMW:[id number] DOB: 02/22/1941 DOA: 01/11/2025  PCP: Millicent Sharper, MD  Admit date: 01/11/2025 Discharge date: 01/13/2025 30 Day Unplanned Readmission Risk Score    Flowsheet Row ED to Hosp-Admission (Current) from 01/11/2025 in [facility] GENERAL MED/SURG UNIT  30 Day Unplanned Readmission Risk Score (%) 12.72 Filed at 01/13/2025 0801    This score is the patient's risk of an unplanned readmission within 30 days of being discharged (0 -100%). The score is based on dignosis, age, lab data, medications, orders, and past utilization.   Low:  0-14.9   Medium: 15-21.9   High: 22-29.9   Extreme: 30 and above          Admitted From: Home Disposition:  Home  Recommendations for Outpatient Follow-up:  Follow up with PCP in 1-2 weeks Please obtain BMP/CBC in one week Please follow up with your PCP on the following pending results: Unresulted Labs (From admission, onward)    None         Home Health: Yes Equipment/Devices: None  Discharge Condition: Stable CODE STATUS: DNR Diet recommendation:  Diet Order             Diet regular Room service appropriate? Yes; Fluid consistency: Thin  Diet effective now                   Subjective: Patient seen and examined, she says I am feeling  much better.  No complaints.  No shortness of breath.  She is eager to go home.  Brief/Interim Summary: Katherine Garrison is a 84 y.o. female with medical history significant for hypertension, CVA, IBS, diverticulitis, chronic constipation, osteoarthritis, anxiety and recent CVA in 11/2024 who presented to the ED for evaluation of URI symptoms.  Patient reports she has been going to outpatient PT a few times a week due to her recent stroke in December. 2 to 3 days ago, she started having chills, sinus congestion and sore throat. She had 3 episodes of emesis 2 nights ago. She reports a productive cough and generalized weakness but denies any chest pain,  fevers, abdominal pain, dysuria, headache or lightheadedness   ED Course: Initial vitals show afebrile, RR 18, HR 70-90s, SBP 120-160s, SpO2 80% on room air, O2 sat dropped to the mid 80s and placed on 2 L [place]. Initial labs significant for positive SARS-CoV-2 by PCR, normal CBC, AST/ALT 70/94, normal renal function, normal lactic acid, UA shows negative nitrite, small leuks, WBC 6-10 and rare bacteria. CXR shows no active disease. Pt received IV NS 1 L bolus, IV Zofran  and IV Rocephin . TRH was consulted for admission and patient was admitted virtually.   Acute hypoxic respiratory failure secondary to COVID-19 infection: No chest x-Ropp abnormality.  Patient was hypoxic requiring 2 to 3 L oxygen.  However she has been off of oxygen and saturating well over 95% on room air.  She was started on dexamethasone  but not remdesivir.  She was afebrile.  She is doing much better.  Ambulatory oximetry completed and she is not requiring any oxygen.  Discharging home on 7 more days of dexamethasone .  Generalized weakness: Evaluated by PT OT, home health PT recommended which I am going to order.  History of CVA/hypertension: Resume home medications.  Discharge plan was discussed with patient and/or family member and they verbalized understanding and agreed with it.  Discharge Diagnoses:  Principal Problem:   COVID-19 Active Problems:   Acute hypoxic respiratory failure (HCC)   History  of CVA (cerebrovascular accident)   Generalized weakness   Essential hypertension    Discharge Instructions   Allergies as of 01/13/2025       Reactions   Latex    Neosporin [neomycin-bacitracin Zn-polymyx] Rash   Sulfa Antibiotics Rash        Medication List     TAKE these medications    acetaminophen  500 MG tablet Commonly known as: TYLENOL  Take 500-1,000 mg by mouth as needed for moderate pain (pain score 4-6) or mild pain (pain score 1-3).   amLODipine  5 MG tablet Commonly known as: NORVASC  Take 5 mg by  mouth at bedtime.   aspirin  81 MG chewable tablet Chew 81 mg by mouth daily.   atorvastatin  40 MG tablet Commonly known as: LIPITOR Take 40 mg by mouth at bedtime.   busPIRone  15 MG tablet Commonly known as: BUSPAR  Take 15 mg by mouth 2 (two) times daily.   clopidogrel  75 MG tablet Commonly known as: PLAVIX  Take 75 mg by mouth.   dexamethasone  6 MG tablet Commonly known as: DECADRON  Take 1 tablet (6 mg total) by mouth daily for 7 days.   fluticasone  50 MCG/ACT nasal spray Commonly known as: FLONASE  Place 1 spray into both nostrils daily as needed for allergies or rhinitis.   losartan  50 MG tablet Commonly known as: COZAAR  Take 50 mg by mouth daily.   MIRALAX  PO Take 17 g by mouth daily as needed (Constipation).   potassium chloride  10 MEQ tablet Commonly known as: KLOR-CON  M Take 10 mEq by mouth daily.   sertraline  50 MG tablet Commonly known as: ZOLOFT  Take 50 mg by mouth daily.   VIACTIV PO Take by mouth.   WOMENS ONE DAILY PO Take 1 tablet by mouth daily.        Contact information for follow-up providers     Millicent Sharper, MD Follow up in 1 week(s).   Specialty: Family Medicine Contact information: [address] [place] [address] [phone]              Contact information for after-discharge care     Home Medical Care     [facility]) .   Service: Home Health Services Contact information: [address] [place] [address] [place]  [address] [phone]                    Allergies[1]  Consultations: None   Procedures/Studies: DG Chest Portable 1 View Result Date: 01/11/2025 CLINICAL DATA:  Cough, COVID positive EXAM: PORTABLE CHEST 1 VIEW COMPARISON:  09/02/2023 FINDINGS: Single frontal view of the chest demonstrates an unremarkable cardiac silhouette. No acute airspace disease, effusion, or pneumothorax. No acute displaced fracture. Densely calcified  bilateral breast prostheses are again noted. Prior left humeral ORIF. IMPRESSION: 1. No acute intrathoracic process. Electronically Signed   By: Sharper Daring M.D.   On: 01/11/2025 16:44     Discharge Exam: Vitals:   01/13/25 0454 01/13/25 0752  BP: (!) 132/55 (!) 153/64  Pulse: 74 60  Resp: 20   Temp: 97.8 F (36.6 C) 98 F (36.7 C)  SpO2: 93% 96%   Vitals:   01/12/25 2000 01/12/25 2351 01/13/25 0454 01/13/25 0752  BP: (!) 144/55 (!) 127/52 (!) 132/55 (!) 153/64  Pulse: 66 64 74 60  Resp: 18 19 20    Temp: 97.6 F (36.4 C) 98.4 F (36.9 C) 97.8 F (36.6 C) 98 F (36.7 C)  TempSrc: Oral  Oral Oral Oral  SpO2: 92% 96% 93% 96%  Weight:      Height:        General: Pt is alert, awake, not in acute distress Cardiovascular: RRR, S1/S2 +, no rubs, no gallops Respiratory: CTA bilaterally, no wheezing, no rhonchi Abdominal: Soft, NT, ND, bowel sounds + Extremities: no edema, no cyanosis    The results of significant diagnostics from this hospitalization (including imaging, microbiology, ancillary and laboratory) are listed below for reference.     Microbiology: Recent Results (from the past 240 hours)  Resp panel by RT-PCR (RSV, Flu A&B, Covid) Anterior Nasal Swab     Status: Abnormal   Collection Time: 01/11/25  3:36 PM   Specimen: Anterior Nasal Swab  Result Value Ref Range Status   SARS Coronavirus 2 by RT PCR POSITIVE (A) NEGATIVE Final    Comment: (NOTE) SARS-CoV-2 target nucleic acids are DETECTED.  The SARS-CoV-2 RNA is generally detectable in upper respiratory specimens during the acute phase of infection. Positive results are indicative of the presence of the identified virus, but do not rule out bacterial infection or co-infection with other pathogens not detected by the test. Clinical correlation with patient history and other diagnostic information is necessary to determine patient infection status. The expected result is Negative.  Fact Sheet for  Patients: bloggercourse.com  Fact Sheet for Healthcare Providers: seriousbroker.it  This test is not yet approved or cleared by the [place]  FDA and  has been authorized for detection and/or diagnosis of SARS-CoV-2 by FDA under an Emergency Use Authorization (EUA).  This EUA will remain in effect (meaning this test can be used) for the duration of  the COVID-19 declaration under Section 564(b)(1) of the A ct, 21 U.S.C. section 360bbb-3(b)(1), unless the authorization is terminated or revoked sooner.     Influenza A by PCR NEGATIVE NEGATIVE Final   Influenza B by PCR NEGATIVE NEGATIVE Final    Comment: (NOTE) The Xpert Xpress SARS-CoV-2/FLU/RSV plus assay is intended as an aid in the diagnosis of influenza from Nasopharyngeal swab specimens and should not be used as a sole basis for treatment. Nasal washings and aspirates are unacceptable for Xpert Xpress SARS-CoV-2/FLU/RSV testing.  Fact Sheet for Patients: bloggercourse.com  Fact Sheet for Healthcare Providers: seriousbroker.it  This test is not yet approved or cleared by the [place]  FDA and has been authorized for detection and/or diagnosis of SARS-CoV-2 by FDA under an Emergency Use Authorization (EUA). This EUA will remain in effect (meaning this test can be used) for the duration of the COVID-19 declaration under Section 564(b)(1) of the Act, 21 U.S.C. section 360bbb-3(b)(1), unless the authorization is terminated or revoked.     Resp Syncytial Virus by PCR NEGATIVE NEGATIVE Final    Comment: (NOTE) Fact Sheet for Patients: bloggercourse.com  Fact Sheet for Healthcare Providers: seriousbroker.it  This test is not yet approved or cleared by the [place]  FDA and has been authorized for detection and/or diagnosis of SARS-CoV-2 by FDA under an Emergency Use  Authorization (EUA). This EUA will remain in effect (meaning this test can be used) for the duration of the COVID-19 declaration under Section 564(b)(1) of the Act, 21 U.S.C. section 360bbb-3(b)(1), unless the authorization is terminated or revoked.  Performed at [facility], [address] [place], [place] [address]      Labs: BNP (last 3 results) No results for input(s): BNP in the last 8760 hours. Basic Metabolic Panel: Recent Labs  Lab 01/11/25 1629 01/12/25 9350  NA 136 138  K 3.6 4.0  CL 98 104  CO2 26 27  GLUCOSE 100* 117*  BUN 16 13  CREATININE 0.62 0.55  CALCIUM  9.0 8.3*  MG  --  2.0  PHOS  --  2.6   Liver Function Tests: Recent Labs  Lab 01/11/25 1629  AST 74*  ALT 94*  ALKPHOS 97  BILITOT 0.8  PROT 7.0  ALBUMIN 4.1   No results for input(s): LIPASE, AMYLASE in the last 168 hours. No results for input(s): AMMONIA in the last 168 hours. CBC: Recent Labs  Lab 01/11/25 1629 01/12/25 0649  WBC 7.0 4.4  NEUTROABS 4.9  --   HGB 12.6 11.0*  HCT 37.7 34.1*  MCV 86.5 87.9  PLT 156 140*   Cardiac Enzymes: No results for input(s): CKTOTAL, CKMB, CKMBINDEX, TROPONINI in the last 168 hours. BNP: Invalid input(s): POCBNP CBG: No results for input(s): GLUCAP in the last 168 hours. D-Dimer No results for input(s): DDIMER in the last 72 hours. Hgb A1c No results for input(s): HGBA1C in the last 72 hours. Lipid Profile No results for input(s): CHOL, HDL, LDLCALC, TRIG, CHOLHDL, LDLDIRECT in the last 72 hours. Thyroid function studies No results for input(s): TSH, T4TOTAL, T3FREE, THYROIDAB in the last 72 hours.  Invalid input(s): FREET3 Anemia work up No results for input(s): VITAMINB12, FOLATE, FERRITIN, TIBC, IRON, RETICCTPCT in the last 72 hours. Urinalysis    Component Value Date/Time   COLORURINE YELLOW 01/11/2025 1629   APPEARANCEUR CLEAR 01/11/2025 1629   LABSPEC 1.020  01/11/2025 1629   PHURINE 6.0 01/11/2025 1629   GLUCOSEU NEGATIVE 01/11/2025 1629   HGBUR TRACE (A) 01/11/2025 1629   BILIRUBINUR NEGATIVE 01/11/2025 1629   KETONESUR 40 (A) 01/11/2025 1629   PROTEINUR 30 (A) 01/11/2025 1629   NITRITE NEGATIVE 01/11/2025 1629   LEUKOCYTESUR SMALL (A) 01/11/2025 1629   Sepsis Labs Recent Labs  Lab 01/11/25 1629 01/12/25 0649  WBC 7.0 4.4   Microbiology Recent Results (from the past 240 hours)  Resp panel by RT-PCR (RSV, Flu A&B, Covid) Anterior Nasal Swab     Status: Abnormal   Collection Time: 01/11/25  3:36 PM   Specimen: Anterior Nasal Swab  Result Value Ref Range Status   SARS Coronavirus 2 by RT PCR POSITIVE (A) NEGATIVE Final    Comment: (NOTE) SARS-CoV-2 target nucleic acids are DETECTED.  The SARS-CoV-2 RNA is generally detectable in upper respiratory specimens during the acute phase of infection. Positive results are indicative of the presence of the identified virus, but do not rule out bacterial infection or co-infection with other pathogens not detected by the test. Clinical correlation with patient history and other diagnostic information is necessary to determine patient infection status. The expected result is Negative.  Fact Sheet for Patients: bloggercourse.com  Fact Sheet for Healthcare Providers: seriousbroker.it  This test is not yet approved or cleared by the [place]  FDA and  has been authorized for detection and/or diagnosis of SARS-CoV-2 by FDA under an Emergency Use Authorization (EUA).  This EUA will remain in effect (meaning this test can be used) for the duration of  the COVID-19 declaration under Section 564(b)(1) of the A ct, 21 U.S.C. section 360bbb-3(b)(1), unless the authorization is terminated or revoked sooner.     Influenza A by PCR NEGATIVE NEGATIVE Final   Influenza B by PCR NEGATIVE NEGATIVE Final    Comment: (NOTE) The Xpert Xpress  SARS-CoV-2/FLU/RSV plus assay is intended as an aid in the diagnosis of influenza from  Nasopharyngeal swab specimens and should not be used as a sole basis for treatment. Nasal washings and aspirates are unacceptable for Xpert Xpress SARS-CoV-2/FLU/RSV testing.  Fact Sheet for Patients: bloggercourse.com  Fact Sheet for Healthcare Providers: seriousbroker.it  This test is not yet approved or cleared by the [place]  FDA and has been authorized for detection and/or diagnosis of SARS-CoV-2 by FDA under an Emergency Use Authorization (EUA). This EUA will remain in effect (meaning this test can be used) for the duration of the COVID-19 declaration under Section 564(b)(1) of the Act, 21 U.S.C. section 360bbb-3(b)(1), unless the authorization is terminated or revoked.     Resp Syncytial Virus by PCR NEGATIVE NEGATIVE Final    Comment: (NOTE) Fact Sheet for Patients: bloggercourse.com  Fact Sheet for Healthcare Providers: seriousbroker.it  This test is not yet approved or cleared by the [place]  FDA and has been authorized for detection and/or diagnosis of SARS-CoV-2 by FDA under an Emergency Use Authorization (EUA). This EUA will remain in effect (meaning this test can be used) for the duration of the COVID-19 declaration under Section 564(b)(1) of the Act, 21 U.S.C. section 360bbb-3(b)(1), unless the authorization is terminated or revoked.  Performed at [facility], [address] [place], [place] [address]     FURTHER DISCHARGE INSTRUCTIONS:   Get Medicines reviewed and adjusted: Please take all your medications with you for your next visit with your Primary MD   Laboratory/radiological data: Please request your Primary MD to go over all hospital tests and procedure/radiological results at the follow up, please ask your Primary MD to get all Hospital  records sent to his/her office.   In some cases, they will be blood work, cultures and biopsy results pending at the time of your discharge. Please request that your primary care M.D. goes through all the records of your hospital data and follows up on these results.   Also Note the following: If you experience worsening of your admission symptoms, develop shortness of breath, life threatening emergency, suicidal or homicidal thoughts you must seek medical attention immediately by calling 911 or calling your MD immediately  if symptoms less severe.   You must read complete instructions/literature along with all the possible adverse reactions/side effects for all the Medicines you take and that have been prescribed to you. Take any new Medicines after you have completely understood and accpet all the possible adverse reactions/side effects.    patient was instructed, not to drive, operate heavy machinery, perform activities at heights, swimming or participation in water activities or provide baby-sitting services while on Pain, Sleep and Anxiety Medications; until their outpatient Physician has advised to do so again. Also recommended to not to take more than prescribed Pain, Sleep and Anxiety Medications.  It is not advisable to combine anxiety, sleep and pain medications without talking with your primary care provider.     Wear Seat belts while driving.   Please note: You were cared for by a hospitalist during your hospital stay. Once you are discharged, your primary care physician will handle any further medical issues. Please note that NO REFILLS for any discharge medications will be authorized once you are discharged, as it is imperative that you return to your primary care physician (or establish a relationship with a primary care physician if you do not have one) for your post hospital discharge needs so that they can reassess your need for medications and monitor your lab values  Time  coordinating discharge: Over 30  minutes  SIGNED:   Fredia Skeeter, MD  [facility] 01/13/2025, 9:58 AM *Please note that this is a verbal dictation therefore any spelling or grammatical errors are due to the Dragon Medical One system interpretation. If 7PM-7AM, please contact night-coverage [email or web address]     [1]  Allergies Allergen Reactions   Latex    Neosporin [Neomycin-Bacitracin Zn-Polymyx] Rash   Sulfa Antibiotics Rash

## 2025-01-13 NOTE — Plan of Care (Signed)

## 2025-01-13 NOTE — Plan of Care (Signed)

## 2025-01-13 NOTE — Progress Notes (Signed)
 Pt ambulated sats stayed above 96% the entire session, pt showed no signs of sob, was able to have a conversation while walking

## 2025-01-13 NOTE — TOC Transition Note (Signed)
 Transition of Care [facility]) - Discharge Note   Patient Details  Name: Katherine Garrison MRN: [id number] Date of Birth: 07-28-1941  [facility]) CM/SW Contact:  Marval Gell, RN Phone Number: 01/13/2025, 10:56 AM   Clinical Narrative:     Notified [facility] that patient will DC today. Reminded nurse that patient's home walker is in her room and to make sure it goes back home w her.  No other ICM needs identified for DC     Barriers to Discharge: Continued Medical Work up   Patient Goals and CMS Choice Patient states their goals for this hospitalization and ongoing recovery are:: to go home          Discharge Placement                       Discharge Plan and Services Additional resources added to the After Visit Summary for                                       Social Drivers of Health (SDOH) Interventions SDOH Screenings   Food Insecurity: Patient Declined (01/12/2025)  Housing: Unknown (01/12/2025)  Transportation Needs: Patient Declined (01/12/2025)  Utilities: Patient Declined (01/12/2025)  Social Connections: Unknown (01/12/2025)  Tobacco Use: Medium Risk (01/11/2025)     Readmission Risk Interventions     No data to display

## 2025-01-13 NOTE — Progress Notes (Signed)
Explained discharge instructions to patient. Reviewed follow up appointment and next medication administration times. Also reviewed education. Patient verbalized having an understanding for instructions given. All belongings are in the patient's possession. IV  removed. No other needs verbalized. Will transport downstairs for discharge.
